# Patient Record
Sex: Female | Born: 1945 | Race: White | Hispanic: No | Marital: Married | Smoking: Never smoker
Health system: Southern US, Community
[De-identification: ages and names within clinical notes are randomized; demographics above are authoritative.]

## PROBLEM LIST (undated history)

## (undated) DIAGNOSIS — D649 Anemia, unspecified: Secondary | ICD-10-CM

## (undated) DIAGNOSIS — R51 Headache: Secondary | ICD-10-CM

## (undated) DIAGNOSIS — N63 Unspecified lump in unspecified breast: Secondary | ICD-10-CM

## (undated) DIAGNOSIS — R519 Headache, unspecified: Secondary | ICD-10-CM

## (undated) DIAGNOSIS — D249 Benign neoplasm of unspecified breast: Secondary | ICD-10-CM

## (undated) DIAGNOSIS — Z801 Family history of malignant neoplasm of trachea, bronchus and lung: Secondary | ICD-10-CM

## (undated) DIAGNOSIS — R238 Other skin changes: Secondary | ICD-10-CM

## (undated) DIAGNOSIS — R233 Spontaneous ecchymoses: Secondary | ICD-10-CM

## (undated) DIAGNOSIS — E785 Hyperlipidemia, unspecified: Secondary | ICD-10-CM

## (undated) DIAGNOSIS — I10 Essential (primary) hypertension: Secondary | ICD-10-CM

## (undated) HISTORY — DX: Family history of malignant neoplasm of trachea, bronchus and lung: Z80.1

## (undated) HISTORY — DX: Unspecified lump in unspecified breast: N63.0

## (undated) HISTORY — DX: Anemia, unspecified: D64.9

## (undated) HISTORY — DX: Headache: R51

## (undated) HISTORY — DX: Other skin changes: R23.8

## (undated) HISTORY — DX: Headache, unspecified: R51.9

## (undated) HISTORY — DX: Spontaneous ecchymoses: R23.3

## (undated) HISTORY — DX: Hyperlipidemia, unspecified: E78.5

## (undated) HISTORY — PX: EYE SURGERY: SHX253

## (undated) HISTORY — DX: Benign neoplasm of unspecified breast: D24.9

## (undated) HISTORY — DX: Essential (primary) hypertension: I10

## (undated) HISTORY — PX: BREAST LUMPECTOMY: SHX2

---

## 1986-03-29 HISTORY — PX: ABDOMINAL HYSTERECTOMY: SHX81

## 2001-10-23 ENCOUNTER — Encounter: Payer: Self-pay | Admitting: Neurosurgery

## 2001-10-23 ENCOUNTER — Encounter: Admission: RE | Admit: 2001-10-23 | Discharge: 2001-10-23 | Payer: Self-pay | Admitting: Neurosurgery

## 2001-11-18 ENCOUNTER — Ambulatory Visit (HOSPITAL_COMMUNITY): Admission: RE | Admit: 2001-11-18 | Discharge: 2001-11-18 | Payer: Self-pay | Admitting: Neurosurgery

## 2001-11-18 ENCOUNTER — Encounter: Payer: Self-pay | Admitting: Neurosurgery

## 2002-05-22 ENCOUNTER — Ambulatory Visit (HOSPITAL_COMMUNITY): Admission: RE | Admit: 2002-05-22 | Discharge: 2002-05-22 | Payer: Self-pay | Admitting: Unknown Physician Specialty

## 2005-06-15 ENCOUNTER — Ambulatory Visit (HOSPITAL_COMMUNITY): Admission: RE | Admit: 2005-06-15 | Discharge: 2005-06-15 | Payer: Self-pay | Admitting: Ophthalmology

## 2008-03-29 HISTORY — PX: ENUCLEATION: SHX628

## 2010-11-03 ENCOUNTER — Other Ambulatory Visit: Payer: Self-pay | Admitting: Family Medicine

## 2010-11-03 DIAGNOSIS — R921 Mammographic calcification found on diagnostic imaging of breast: Secondary | ICD-10-CM

## 2010-11-13 ENCOUNTER — Ambulatory Visit
Admission: RE | Admit: 2010-11-13 | Discharge: 2010-11-13 | Disposition: A | Discharge status: Home / Self Care | Payer: BC Managed Care – PPO | Source: Ambulatory Visit | Attending: Family Medicine | Admitting: Family Medicine

## 2010-11-13 ENCOUNTER — Other Ambulatory Visit: Payer: Self-pay | Admitting: Family Medicine

## 2010-11-13 DIAGNOSIS — R921 Mammographic calcification found on diagnostic imaging of breast: Secondary | ICD-10-CM

## 2010-12-01 ENCOUNTER — Ambulatory Visit (INDEPENDENT_AMBULATORY_CARE_PROVIDER_SITE_OTHER): Payer: BC Managed Care – PPO | Admitting: Surgery

## 2010-12-01 ENCOUNTER — Encounter (INDEPENDENT_AMBULATORY_CARE_PROVIDER_SITE_OTHER): Payer: Self-pay | Admitting: Surgery

## 2010-12-01 ENCOUNTER — Other Ambulatory Visit (INDEPENDENT_AMBULATORY_CARE_PROVIDER_SITE_OTHER): Payer: Self-pay | Admitting: Surgery

## 2010-12-01 VITALS — BP 134/82 | HR 84 | Temp 97.2°F | Ht 64.0 in | Wt 142.6 lb

## 2010-12-01 DIAGNOSIS — N63 Unspecified lump in unspecified breast: Secondary | ICD-10-CM

## 2010-12-01 DIAGNOSIS — N6320 Unspecified lump in the left breast, unspecified quadrant: Secondary | ICD-10-CM

## 2010-12-01 DIAGNOSIS — D249 Benign neoplasm of unspecified breast: Secondary | ICD-10-CM

## 2010-12-01 HISTORY — DX: Benign neoplasm of unspecified breast: D24.9

## 2010-12-01 NOTE — Patient Instructions (Addendum)
We will arrange surgery to remove the area found on your left breast. You will have to have a guide wire placed at [facility] just before the surgery.  Stop fish oil and aspirin 5 days before surgery.
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
## 2010-12-08 ENCOUNTER — Telehealth (INDEPENDENT_AMBULATORY_CARE_PROVIDER_SITE_OTHER): Payer: Self-pay | Admitting: General Surgery

## 2010-12-08 ENCOUNTER — Encounter (HOSPITAL_BASED_OUTPATIENT_CLINIC_OR_DEPARTMENT_OTHER)
Admission: RE | Admit: 2010-12-08 | Discharge: 2010-12-08 | Disposition: A | Discharge status: Home / Self Care | Payer: BC Managed Care – PPO | Source: Ambulatory Visit | Attending: Surgery | Admitting: Surgery

## 2010-12-08 LAB — BASIC METABOLIC PANEL
CO2: 28 mEq/L (ref 19–32)
Calcium: 10.4 mg/dL (ref 8.4–10.5)
Creatinine, Ser: 0.96 mg/dL (ref 0.50–1.10)
Glucose, Bld: 102 mg/dL — ABNORMAL HIGH (ref 70–99)

## 2010-12-08 NOTE — Telephone Encounter (Signed)
Message copied by Liliana Cline on Tue Dec 08, 2010  4:45 PM ------      Message from: Currie Paris      Created: Tue Dec 08, 2010  4:40 PM       These labs are OK for surgery

## 2010-12-08 NOTE — Telephone Encounter (Signed)
Labs okay for surgery faxed to pre-op.  

## 2010-12-10 ENCOUNTER — Other Ambulatory Visit (INDEPENDENT_AMBULATORY_CARE_PROVIDER_SITE_OTHER): Payer: Self-pay | Admitting: Surgery

## 2010-12-10 ENCOUNTER — Ambulatory Visit
Admission: RE | Admit: 2010-12-10 | Discharge: 2010-12-10 | Disposition: A | Discharge status: Home / Self Care | Payer: BC Managed Care – PPO | Source: Ambulatory Visit | Attending: Surgery | Admitting: Surgery

## 2010-12-10 ENCOUNTER — Encounter (INDEPENDENT_AMBULATORY_CARE_PROVIDER_SITE_OTHER): Payer: Self-pay | Admitting: Surgery

## 2010-12-10 ENCOUNTER — Ambulatory Visit (HOSPITAL_BASED_OUTPATIENT_CLINIC_OR_DEPARTMENT_OTHER)
Admission: RE | Admit: 2010-12-10 | Discharge: 2010-12-10 | Disposition: A | Discharge status: Home / Self Care | Payer: BC Managed Care – PPO | Source: Ambulatory Visit | Attending: Surgery | Admitting: Surgery

## 2010-12-10 DIAGNOSIS — N6320 Unspecified lump in the left breast, unspecified quadrant: Secondary | ICD-10-CM

## 2010-12-10 DIAGNOSIS — D249 Benign neoplasm of unspecified breast: Secondary | ICD-10-CM | POA: Insufficient documentation

## 2010-12-10 DIAGNOSIS — Z01812 Encounter for preprocedural laboratory examination: Secondary | ICD-10-CM | POA: Insufficient documentation

## 2010-12-10 DIAGNOSIS — Z01810 Encounter for preprocedural cardiovascular examination: Secondary | ICD-10-CM | POA: Insufficient documentation

## 2010-12-10 HISTORY — PX: BREAST SURGERY: SHX581

## 2010-12-10 HISTORY — PX: BREAST EXCISIONAL BIOPSY: SUR124

## 2010-12-10 NOTE — Op Note (Signed)
  [name], Pamela Blanchard               ACCOUNT NO.:  [id number]  MEDICAL RECORD NO.:  [id number]  LOCATION:                                 FACILITY:  PHYSICIAN:  Currie Paris, M.D.   DATE OF BIRTH:  DATE OF PROCEDURE: DATE OF DISCHARGE:                              OPERATIVE REPORT   PREOPERATIVE DIAGNOSIS:  Papilloma left breast, lower inner quadrant.  POSTOPERATIVE DIAGNOSIS:  Papilloma left breast, lower inner quadrant.  PROCEDURE:  Needle guided lumpectomy.  SURGEON:  Currie Paris, MD.  ANESTHESIA:  General.  CLINICAL HISTORY:  This is a 65 year old lady who recently presented with a mammographic abnormality and a biopsy showed a papilloma.  To rule out some foci of cancer and the papilloma, excisional biopsy was recommended.  DESCRIPTION OF PROCEDURE:  I saw the patient in the holding area and she had no further questions.  We confirmed the plans for the procedure as above.  The mammogram localizing films were reviewed.  The clip appeared to be 1.5 cm deep to the skin.  The guidewire appeared to transverse from medial to lateral.  The patient was taken to the operating room after satisfactory general anesthesia had been obtained.  The breast was prepped and draped and the time-out was done.  I then made a straight incision directly over the guidewire path, starting just medial to the guidewire as there was some induration right at the guidewire site, which I attributed to some hematoma and post biopsy changes from the original core biopsy.  I then elevated the skin a little bit superiorly and inferiorly, then divided the breast tissue going down towards the chest wall starting medially then superiorly, then inferiorly, and then under it, although did not go all the way down to the chest wall for the deep margins since this appeared to be benign. I continued to take a cylinder of tissue around the guidewire until I got beyond the tip.  I made sure  everything was dry.  I spent several minutes irrigating and then closed in layers with 3-0 Vicryl, 4-0 Monocryl, subcuticular, and Dermabond.  I used about 20 mL of 0.25% plain Marcaine to help with postop anesthesia.  Specimen mammogram showed the clip to be in the specimen and we thought we had the area out completely.  The patient tolerated the procedure well and there were no complications.  All counts were correct.     Currie Paris, M.D.     CJS/MEDQ  D:  12/10/2010  T:  12/10/2010  Job:  [id number]  Electronically Signed by Cyndia Bent M.D. on 12/10/2010 05:18:50 PM

## 2010-12-14 ENCOUNTER — Telehealth (INDEPENDENT_AMBULATORY_CARE_PROVIDER_SITE_OTHER): Payer: Self-pay | Admitting: General Surgery

## 2010-12-14 NOTE — Telephone Encounter (Signed)
Message copied by Liliana Cline on Mon Dec 14, 2010  9:38 AM ------      Message from: Currie Paris      Created: Sat Dec 12, 2010  1:14 PM       Tell her path is benign and as expected

## 2010-12-14 NOTE — Telephone Encounter (Signed)
Patient made aware of path results. Will follow up at appt and call with any questions prior.  

## 2010-12-24 ENCOUNTER — Ambulatory Visit (INDEPENDENT_AMBULATORY_CARE_PROVIDER_SITE_OTHER): Payer: BC Managed Care – PPO | Admitting: Surgery

## 2010-12-24 ENCOUNTER — Encounter (INDEPENDENT_AMBULATORY_CARE_PROVIDER_SITE_OTHER): Payer: Self-pay | Admitting: Surgery

## 2010-12-24 VITALS — BP 124/82 | HR 84 | Temp 97.5°F | Resp 12 | Ht 64.0 in | Wt 141.2 lb

## 2010-12-24 DIAGNOSIS — D249 Benign neoplasm of unspecified breast: Secondary | ICD-10-CM

## 2010-12-24 NOTE — Progress Notes (Signed)
Pamela Blanchard    [id number] 12/24/2010    1945-05-01   CC: Post op visit  HPI: The patient returns for post op follow-up. She underwent a left needle guided removal of a breast mass on 12/10/10. Over all she feels that she is doing well.  PE: The incision is healing nicely and there is no evidence of infection or hematoma.    DATA REVIEWED: Pathology report showed intraductal papilloma  IMPRESSION: Patient doing well.   PLAN: She will return p.r.n. .

## 2010-12-24 NOTE — Patient Instructions (Signed)
We will see you again on an as needed basis. Please call the office at [phone] if you have any questions or concerns. Thank you for allowing us to take care of you.  

## 2013-06-28 ENCOUNTER — Other Ambulatory Visit (HOSPITAL_COMMUNITY): Payer: Self-pay | Admitting: Neurosurgery

## 2013-06-28 DIAGNOSIS — M5126 Other intervertebral disc displacement, lumbar region: Secondary | ICD-10-CM

## 2013-06-29 ENCOUNTER — Ambulatory Visit (HOSPITAL_COMMUNITY)
Admission: RE | Admit: 2013-06-29 | Discharge: 2013-06-29 | Disposition: A | Discharge status: Home / Self Care | Payer: Medicare Other | Source: Ambulatory Visit | Attending: Neurosurgery | Admitting: Neurosurgery

## 2013-06-29 DIAGNOSIS — M5126 Other intervertebral disc displacement, lumbar region: Secondary | ICD-10-CM

## 2013-06-29 DIAGNOSIS — M79609 Pain in unspecified limb: Secondary | ICD-10-CM | POA: Insufficient documentation

## 2013-06-29 DIAGNOSIS — R252 Cramp and spasm: Secondary | ICD-10-CM | POA: Insufficient documentation

## 2013-06-29 DIAGNOSIS — M48061 Spinal stenosis, lumbar region without neurogenic claudication: Secondary | ICD-10-CM | POA: Insufficient documentation

## 2013-06-29 LAB — POCT I-STAT CREATININE: CREATININE: 1.2 mg/dL — AB (ref 0.50–1.10)

## 2013-06-29 MED ORDER — GADOBENATE DIMEGLUMINE 529 MG/ML IV SOLN
13.0000 mL | Freq: Once | INTRAVENOUS | Status: AC | PRN
  Administered 2013-06-29: 13 mL via INTRAVENOUS

## 2015-07-21 ENCOUNTER — Other Ambulatory Visit: Payer: Self-pay

## 2015-07-21 DIAGNOSIS — Z1231 Encounter for screening mammogram for malignant neoplasm of breast: Secondary | ICD-10-CM

## 2015-09-02 ENCOUNTER — Ambulatory Visit
Admission: RE | Admit: 2015-09-02 | Discharge: 2015-09-02 | Disposition: A | Discharge status: Home / Self Care | Payer: Medicare Other | Source: Ambulatory Visit

## 2015-09-02 ENCOUNTER — Other Ambulatory Visit: Payer: Self-pay | Admitting: Family Medicine

## 2015-09-02 DIAGNOSIS — Z1231 Encounter for screening mammogram for malignant neoplasm of breast: Secondary | ICD-10-CM

## 2016-08-02 ENCOUNTER — Other Ambulatory Visit: Payer: Self-pay | Admitting: Family Medicine

## 2016-08-02 DIAGNOSIS — Z1231 Encounter for screening mammogram for malignant neoplasm of breast: Secondary | ICD-10-CM

## 2016-09-02 ENCOUNTER — Ambulatory Visit
Admission: RE | Admit: 2016-09-02 | Discharge: 2016-09-02 | Disposition: A | Discharge status: Home / Self Care | Payer: Medicare Other | Source: Ambulatory Visit | Attending: Family Medicine | Admitting: Family Medicine

## 2016-09-02 DIAGNOSIS — Z1231 Encounter for screening mammogram for malignant neoplasm of breast: Secondary | ICD-10-CM

## 2017-08-01 ENCOUNTER — Other Ambulatory Visit: Payer: Self-pay | Admitting: Family Medicine

## 2017-08-01 DIAGNOSIS — Z1231 Encounter for screening mammogram for malignant neoplasm of breast: Secondary | ICD-10-CM

## 2017-09-05 ENCOUNTER — Ambulatory Visit
Admission: RE | Admit: 2017-09-05 | Discharge: 2017-09-05 | Disposition: A | Discharge status: Home / Self Care | Payer: 59 | Source: Ambulatory Visit | Attending: Family Medicine | Admitting: Family Medicine

## 2017-09-05 DIAGNOSIS — Z1231 Encounter for screening mammogram for malignant neoplasm of breast: Secondary | ICD-10-CM

## 2018-07-28 ENCOUNTER — Other Ambulatory Visit: Payer: Self-pay | Admitting: Family Medicine

## 2018-07-28 DIAGNOSIS — Z1231 Encounter for screening mammogram for malignant neoplasm of breast: Secondary | ICD-10-CM

## 2018-08-02 ENCOUNTER — Ambulatory Visit: Payer: Self-pay | Admitting: Allergy & Immunology

## 2018-08-11 ENCOUNTER — Ambulatory Visit: Payer: Self-pay | Admitting: Allergy & Immunology

## 2018-09-21 ENCOUNTER — Other Ambulatory Visit: Payer: Self-pay

## 2018-09-21 ENCOUNTER — Ambulatory Visit
Admission: RE | Admit: 2018-09-21 | Discharge: 2018-09-21 | Disposition: A | Discharge status: Home / Self Care | Payer: Medicare Other | Source: Ambulatory Visit | Attending: Family Medicine | Admitting: Family Medicine

## 2018-09-21 DIAGNOSIS — Z1231 Encounter for screening mammogram for malignant neoplasm of breast: Secondary | ICD-10-CM

## 2019-08-14 ENCOUNTER — Other Ambulatory Visit: Payer: Self-pay | Admitting: Family Medicine

## 2019-08-14 DIAGNOSIS — Z1231 Encounter for screening mammogram for malignant neoplasm of breast: Secondary | ICD-10-CM

## 2019-09-24 ENCOUNTER — Ambulatory Visit
Admission: RE | Admit: 2019-09-24 | Discharge: 2019-09-24 | Disposition: A | Discharge status: Home / Self Care | Payer: Medicare PPO | Source: Ambulatory Visit | Attending: Family Medicine | Admitting: Family Medicine

## 2019-09-24 ENCOUNTER — Other Ambulatory Visit: Payer: Self-pay

## 2019-09-24 DIAGNOSIS — Z1231 Encounter for screening mammogram for malignant neoplasm of breast: Secondary | ICD-10-CM

## 2019-11-27 DIAGNOSIS — R7301 Impaired fasting glucose: Secondary | ICD-10-CM | POA: Diagnosis not present

## 2019-11-27 DIAGNOSIS — E7849 Other hyperlipidemia: Secondary | ICD-10-CM | POA: Diagnosis not present

## 2019-11-27 DIAGNOSIS — I10 Essential (primary) hypertension: Secondary | ICD-10-CM | POA: Diagnosis not present

## 2019-11-27 DIAGNOSIS — E119 Type 2 diabetes mellitus without complications: Secondary | ICD-10-CM | POA: Diagnosis not present

## 2019-12-27 DIAGNOSIS — I10 Essential (primary) hypertension: Secondary | ICD-10-CM | POA: Diagnosis not present

## 2019-12-27 DIAGNOSIS — E7849 Other hyperlipidemia: Secondary | ICD-10-CM | POA: Diagnosis not present

## 2019-12-27 DIAGNOSIS — E119 Type 2 diabetes mellitus without complications: Secondary | ICD-10-CM | POA: Diagnosis not present

## 2019-12-27 DIAGNOSIS — R7301 Impaired fasting glucose: Secondary | ICD-10-CM | POA: Diagnosis not present

## 2020-01-09 DIAGNOSIS — E119 Type 2 diabetes mellitus without complications: Secondary | ICD-10-CM | POA: Diagnosis not present

## 2020-01-09 DIAGNOSIS — E782 Mixed hyperlipidemia: Secondary | ICD-10-CM | POA: Diagnosis not present

## 2020-01-09 DIAGNOSIS — I10 Essential (primary) hypertension: Secondary | ICD-10-CM | POA: Diagnosis not present

## 2020-01-11 DIAGNOSIS — E782 Mixed hyperlipidemia: Secondary | ICD-10-CM | POA: Diagnosis not present

## 2020-01-11 DIAGNOSIS — R7301 Impaired fasting glucose: Secondary | ICD-10-CM | POA: Diagnosis not present

## 2020-01-11 DIAGNOSIS — I10 Essential (primary) hypertension: Secondary | ICD-10-CM | POA: Diagnosis not present

## 2020-01-11 DIAGNOSIS — Z6825 Body mass index (BMI) 25.0-25.9, adult: Secondary | ICD-10-CM | POA: Diagnosis not present

## 2020-01-11 DIAGNOSIS — F419 Anxiety disorder, unspecified: Secondary | ICD-10-CM | POA: Diagnosis not present

## 2020-01-11 DIAGNOSIS — R4582 Worries: Secondary | ICD-10-CM | POA: Diagnosis not present

## 2020-01-11 DIAGNOSIS — F331 Major depressive disorder, recurrent, moderate: Secondary | ICD-10-CM | POA: Diagnosis not present

## 2020-03-28 DIAGNOSIS — E7849 Other hyperlipidemia: Secondary | ICD-10-CM | POA: Diagnosis not present

## 2020-03-28 DIAGNOSIS — E119 Type 2 diabetes mellitus without complications: Secondary | ICD-10-CM | POA: Diagnosis not present

## 2020-03-28 DIAGNOSIS — I10 Essential (primary) hypertension: Secondary | ICD-10-CM | POA: Diagnosis not present

## 2020-03-31 DIAGNOSIS — L814 Other melanin hyperpigmentation: Secondary | ICD-10-CM | POA: Diagnosis not present

## 2020-03-31 DIAGNOSIS — I781 Nevus, non-neoplastic: Secondary | ICD-10-CM | POA: Diagnosis not present

## 2020-03-31 DIAGNOSIS — L570 Actinic keratosis: Secondary | ICD-10-CM | POA: Diagnosis not present

## 2020-03-31 DIAGNOSIS — D1801 Hemangioma of skin and subcutaneous tissue: Secondary | ICD-10-CM | POA: Diagnosis not present

## 2020-04-17 DIAGNOSIS — H524 Presbyopia: Secondary | ICD-10-CM | POA: Diagnosis not present

## 2020-04-17 DIAGNOSIS — H26492 Other secondary cataract, left eye: Secondary | ICD-10-CM | POA: Diagnosis not present

## 2020-04-17 DIAGNOSIS — H54415A Blindness right eye category 5, normal vision left eye: Secondary | ICD-10-CM | POA: Diagnosis not present

## 2020-04-17 DIAGNOSIS — Z961 Presence of intraocular lens: Secondary | ICD-10-CM | POA: Diagnosis not present

## 2020-04-26 DIAGNOSIS — E119 Type 2 diabetes mellitus without complications: Secondary | ICD-10-CM | POA: Diagnosis not present

## 2020-04-26 DIAGNOSIS — R7301 Impaired fasting glucose: Secondary | ICD-10-CM | POA: Diagnosis not present

## 2020-04-26 DIAGNOSIS — I10 Essential (primary) hypertension: Secondary | ICD-10-CM | POA: Diagnosis not present

## 2020-04-26 DIAGNOSIS — E7849 Other hyperlipidemia: Secondary | ICD-10-CM | POA: Diagnosis not present

## 2020-05-22 DIAGNOSIS — H26492 Other secondary cataract, left eye: Secondary | ICD-10-CM | POA: Diagnosis not present

## 2020-06-16 DIAGNOSIS — E7800 Pure hypercholesterolemia, unspecified: Secondary | ICD-10-CM | POA: Diagnosis not present

## 2020-06-16 DIAGNOSIS — Z1329 Encounter for screening for other suspected endocrine disorder: Secondary | ICD-10-CM | POA: Diagnosis not present

## 2020-06-16 DIAGNOSIS — Z0000 Encounter for general adult medical examination without abnormal findings: Secondary | ICD-10-CM | POA: Diagnosis not present

## 2020-06-16 DIAGNOSIS — I10 Essential (primary) hypertension: Secondary | ICD-10-CM | POA: Diagnosis not present

## 2020-06-16 DIAGNOSIS — E7801 Familial hypercholesterolemia: Secondary | ICD-10-CM | POA: Diagnosis not present

## 2020-06-16 DIAGNOSIS — Z1159 Encounter for screening for other viral diseases: Secondary | ICD-10-CM | POA: Diagnosis not present

## 2020-06-16 DIAGNOSIS — E119 Type 2 diabetes mellitus without complications: Secondary | ICD-10-CM | POA: Diagnosis not present

## 2020-06-19 DIAGNOSIS — R7301 Impaired fasting glucose: Secondary | ICD-10-CM | POA: Diagnosis not present

## 2020-06-19 DIAGNOSIS — Z0001 Encounter for general adult medical examination with abnormal findings: Secondary | ICD-10-CM | POA: Diagnosis not present

## 2020-06-19 DIAGNOSIS — Z1331 Encounter for screening for depression: Secondary | ICD-10-CM | POA: Diagnosis not present

## 2020-06-19 DIAGNOSIS — F419 Anxiety disorder, unspecified: Secondary | ICD-10-CM | POA: Diagnosis not present

## 2020-06-19 DIAGNOSIS — I10 Essential (primary) hypertension: Secondary | ICD-10-CM | POA: Diagnosis not present

## 2020-06-19 DIAGNOSIS — Z1389 Encounter for screening for other disorder: Secondary | ICD-10-CM | POA: Diagnosis not present

## 2020-06-19 DIAGNOSIS — E7849 Other hyperlipidemia: Secondary | ICD-10-CM | POA: Diagnosis not present

## 2020-06-19 DIAGNOSIS — F331 Major depressive disorder, recurrent, moderate: Secondary | ICD-10-CM | POA: Diagnosis not present

## 2020-07-04 DIAGNOSIS — Z23 Encounter for immunization: Secondary | ICD-10-CM | POA: Diagnosis not present

## 2020-07-26 DIAGNOSIS — E119 Type 2 diabetes mellitus without complications: Secondary | ICD-10-CM | POA: Diagnosis not present

## 2020-07-26 DIAGNOSIS — I10 Essential (primary) hypertension: Secondary | ICD-10-CM | POA: Diagnosis not present

## 2020-07-26 DIAGNOSIS — R7301 Impaired fasting glucose: Secondary | ICD-10-CM | POA: Diagnosis not present

## 2020-07-26 DIAGNOSIS — E7849 Other hyperlipidemia: Secondary | ICD-10-CM | POA: Diagnosis not present

## 2020-08-15 ENCOUNTER — Other Ambulatory Visit: Payer: Self-pay | Admitting: Family Medicine

## 2020-08-15 DIAGNOSIS — Z1231 Encounter for screening mammogram for malignant neoplasm of breast: Secondary | ICD-10-CM

## 2020-08-25 DIAGNOSIS — R7301 Impaired fasting glucose: Secondary | ICD-10-CM | POA: Diagnosis not present

## 2020-08-25 DIAGNOSIS — I10 Essential (primary) hypertension: Secondary | ICD-10-CM | POA: Diagnosis not present

## 2020-08-25 DIAGNOSIS — E119 Type 2 diabetes mellitus without complications: Secondary | ICD-10-CM | POA: Diagnosis not present

## 2020-08-25 DIAGNOSIS — E7849 Other hyperlipidemia: Secondary | ICD-10-CM | POA: Diagnosis not present

## 2020-09-25 DIAGNOSIS — R7301 Impaired fasting glucose: Secondary | ICD-10-CM | POA: Diagnosis not present

## 2020-09-25 DIAGNOSIS — E119 Type 2 diabetes mellitus without complications: Secondary | ICD-10-CM | POA: Diagnosis not present

## 2020-09-25 DIAGNOSIS — E7849 Other hyperlipidemia: Secondary | ICD-10-CM | POA: Diagnosis not present

## 2020-09-25 DIAGNOSIS — I10 Essential (primary) hypertension: Secondary | ICD-10-CM | POA: Diagnosis not present

## 2020-10-06 ENCOUNTER — Ambulatory Visit
Admission: RE | Admit: 2020-10-06 | Discharge: 2020-10-06 | Disposition: A | Discharge status: Home / Self Care | Payer: Medicare PPO | Source: Ambulatory Visit | Attending: Family Medicine | Admitting: Family Medicine

## 2020-10-06 ENCOUNTER — Other Ambulatory Visit: Payer: Self-pay

## 2020-10-06 DIAGNOSIS — Z1231 Encounter for screening mammogram for malignant neoplasm of breast: Secondary | ICD-10-CM

## 2020-10-14 ENCOUNTER — Ambulatory Visit: Payer: Medicare PPO

## 2020-10-26 DIAGNOSIS — E7849 Other hyperlipidemia: Secondary | ICD-10-CM | POA: Diagnosis not present

## 2020-10-26 DIAGNOSIS — R7301 Impaired fasting glucose: Secondary | ICD-10-CM | POA: Diagnosis not present

## 2020-10-26 DIAGNOSIS — E119 Type 2 diabetes mellitus without complications: Secondary | ICD-10-CM | POA: Diagnosis not present

## 2020-10-26 DIAGNOSIS — I10 Essential (primary) hypertension: Secondary | ICD-10-CM | POA: Diagnosis not present

## 2020-12-16 DIAGNOSIS — E7800 Pure hypercholesterolemia, unspecified: Secondary | ICD-10-CM | POA: Diagnosis not present

## 2020-12-16 DIAGNOSIS — I10 Essential (primary) hypertension: Secondary | ICD-10-CM | POA: Diagnosis not present

## 2020-12-16 DIAGNOSIS — E7801 Familial hypercholesterolemia: Secondary | ICD-10-CM | POA: Diagnosis not present

## 2020-12-16 DIAGNOSIS — R7301 Impaired fasting glucose: Secondary | ICD-10-CM | POA: Diagnosis not present

## 2020-12-16 DIAGNOSIS — E782 Mixed hyperlipidemia: Secondary | ICD-10-CM | POA: Diagnosis not present

## 2020-12-16 DIAGNOSIS — E119 Type 2 diabetes mellitus without complications: Secondary | ICD-10-CM | POA: Diagnosis not present

## 2020-12-16 DIAGNOSIS — E7849 Other hyperlipidemia: Secondary | ICD-10-CM | POA: Diagnosis not present

## 2020-12-19 DIAGNOSIS — I10 Essential (primary) hypertension: Secondary | ICD-10-CM | POA: Diagnosis not present

## 2020-12-19 DIAGNOSIS — R7301 Impaired fasting glucose: Secondary | ICD-10-CM | POA: Diagnosis not present

## 2020-12-19 DIAGNOSIS — F331 Major depressive disorder, recurrent, moderate: Secondary | ICD-10-CM | POA: Diagnosis not present

## 2020-12-19 DIAGNOSIS — F419 Anxiety disorder, unspecified: Secondary | ICD-10-CM | POA: Diagnosis not present

## 2020-12-19 DIAGNOSIS — R4582 Worries: Secondary | ICD-10-CM | POA: Diagnosis not present

## 2020-12-19 DIAGNOSIS — Z23 Encounter for immunization: Secondary | ICD-10-CM | POA: Diagnosis not present

## 2020-12-19 DIAGNOSIS — Z6824 Body mass index (BMI) 24.0-24.9, adult: Secondary | ICD-10-CM | POA: Diagnosis not present

## 2020-12-19 DIAGNOSIS — E7849 Other hyperlipidemia: Secondary | ICD-10-CM | POA: Diagnosis not present

## 2020-12-23 DIAGNOSIS — Z23 Encounter for immunization: Secondary | ICD-10-CM | POA: Diagnosis not present

## 2021-02-03 DIAGNOSIS — Z6824 Body mass index (BMI) 24.0-24.9, adult: Secondary | ICD-10-CM | POA: Diagnosis not present

## 2021-02-03 DIAGNOSIS — R300 Dysuria: Secondary | ICD-10-CM | POA: Diagnosis not present

## 2021-03-31 DIAGNOSIS — Z85828 Personal history of other malignant neoplasm of skin: Secondary | ICD-10-CM | POA: Diagnosis not present

## 2021-03-31 DIAGNOSIS — L738 Other specified follicular disorders: Secondary | ICD-10-CM | POA: Diagnosis not present

## 2021-03-31 DIAGNOSIS — Z1283 Encounter for screening for malignant neoplasm of skin: Secondary | ICD-10-CM | POA: Diagnosis not present

## 2021-03-31 DIAGNOSIS — L570 Actinic keratosis: Secondary | ICD-10-CM | POA: Diagnosis not present

## 2021-03-31 DIAGNOSIS — D485 Neoplasm of uncertain behavior of skin: Secondary | ICD-10-CM | POA: Diagnosis not present

## 2021-05-12 DIAGNOSIS — Z961 Presence of intraocular lens: Secondary | ICD-10-CM | POA: Diagnosis not present

## 2021-05-12 DIAGNOSIS — Z970 Presence of artificial eye: Secondary | ICD-10-CM | POA: Diagnosis not present

## 2021-06-19 DIAGNOSIS — R5383 Other fatigue: Secondary | ICD-10-CM | POA: Diagnosis not present

## 2021-06-19 DIAGNOSIS — E7800 Pure hypercholesterolemia, unspecified: Secondary | ICD-10-CM | POA: Diagnosis not present

## 2021-06-19 DIAGNOSIS — Z1329 Encounter for screening for other suspected endocrine disorder: Secondary | ICD-10-CM | POA: Diagnosis not present

## 2021-06-19 DIAGNOSIS — E7801 Familial hypercholesterolemia: Secondary | ICD-10-CM | POA: Diagnosis not present

## 2021-06-19 DIAGNOSIS — I10 Essential (primary) hypertension: Secondary | ICD-10-CM | POA: Diagnosis not present

## 2021-06-19 DIAGNOSIS — E782 Mixed hyperlipidemia: Secondary | ICD-10-CM | POA: Diagnosis not present

## 2021-06-19 DIAGNOSIS — E7849 Other hyperlipidemia: Secondary | ICD-10-CM | POA: Diagnosis not present

## 2021-06-22 DIAGNOSIS — I10 Essential (primary) hypertension: Secondary | ICD-10-CM | POA: Diagnosis not present

## 2021-06-22 DIAGNOSIS — Z1331 Encounter for screening for depression: Secondary | ICD-10-CM | POA: Diagnosis not present

## 2021-06-22 DIAGNOSIS — Z23 Encounter for immunization: Secondary | ICD-10-CM | POA: Diagnosis not present

## 2021-06-22 DIAGNOSIS — R7301 Impaired fasting glucose: Secondary | ICD-10-CM | POA: Diagnosis not present

## 2021-06-22 DIAGNOSIS — Z1389 Encounter for screening for other disorder: Secondary | ICD-10-CM | POA: Diagnosis not present

## 2021-06-22 DIAGNOSIS — Z1212 Encounter for screening for malignant neoplasm of rectum: Secondary | ICD-10-CM | POA: Diagnosis not present

## 2021-06-22 DIAGNOSIS — E7849 Other hyperlipidemia: Secondary | ICD-10-CM | POA: Diagnosis not present

## 2021-06-22 DIAGNOSIS — Z0001 Encounter for general adult medical examination with abnormal findings: Secondary | ICD-10-CM | POA: Diagnosis not present

## 2021-07-30 DIAGNOSIS — M810 Age-related osteoporosis without current pathological fracture: Secondary | ICD-10-CM | POA: Diagnosis not present

## 2021-07-30 DIAGNOSIS — M8589 Other specified disorders of bone density and structure, multiple sites: Secondary | ICD-10-CM | POA: Diagnosis not present

## 2021-10-08 DIAGNOSIS — Z1231 Encounter for screening mammogram for malignant neoplasm of breast: Secondary | ICD-10-CM | POA: Diagnosis not present

## 2021-11-23 DIAGNOSIS — E7849 Other hyperlipidemia: Secondary | ICD-10-CM | POA: Diagnosis not present

## 2021-11-23 DIAGNOSIS — R5383 Other fatigue: Secondary | ICD-10-CM | POA: Diagnosis not present

## 2021-11-23 DIAGNOSIS — I10 Essential (primary) hypertension: Secondary | ICD-10-CM | POA: Diagnosis not present

## 2021-11-23 DIAGNOSIS — E7801 Familial hypercholesterolemia: Secondary | ICD-10-CM | POA: Diagnosis not present

## 2021-11-23 DIAGNOSIS — R7301 Impaired fasting glucose: Secondary | ICD-10-CM | POA: Diagnosis not present

## 2021-11-23 DIAGNOSIS — E119 Type 2 diabetes mellitus without complications: Secondary | ICD-10-CM | POA: Diagnosis not present

## 2021-11-25 DIAGNOSIS — E7849 Other hyperlipidemia: Secondary | ICD-10-CM | POA: Diagnosis not present

## 2021-11-25 DIAGNOSIS — I10 Essential (primary) hypertension: Secondary | ICD-10-CM | POA: Diagnosis not present

## 2021-11-25 DIAGNOSIS — F419 Anxiety disorder, unspecified: Secondary | ICD-10-CM | POA: Diagnosis not present

## 2021-11-25 DIAGNOSIS — F331 Major depressive disorder, recurrent, moderate: Secondary | ICD-10-CM | POA: Diagnosis not present

## 2021-11-25 DIAGNOSIS — R7301 Impaired fasting glucose: Secondary | ICD-10-CM | POA: Diagnosis not present

## 2021-11-25 DIAGNOSIS — R0789 Other chest pain: Secondary | ICD-10-CM | POA: Diagnosis not present

## 2021-11-25 DIAGNOSIS — Z6824 Body mass index (BMI) 24.0-24.9, adult: Secondary | ICD-10-CM | POA: Diagnosis not present

## 2021-11-25 DIAGNOSIS — R4582 Worries: Secondary | ICD-10-CM | POA: Diagnosis not present

## 2021-12-08 DIAGNOSIS — Z23 Encounter for immunization: Secondary | ICD-10-CM | POA: Diagnosis not present

## 2021-12-25 IMAGING — MG MM DIGITAL SCREENING BILAT W/ TOMO AND CAD
8 series · 8 of 24 positions shown · non-contrast
Comparison: Previous exam(s).

CLINICAL DATA: Screening.

EXAM:
DIGITAL SCREENING BILATERAL MAMMOGRAM WITH TOMOSYNTHESIS AND CAD
TECHNIQUE: Bilateral screening digital craniocaudal and mediolateral oblique
mammograms were obtained. Bilateral screening digital breast
tomosynthesis was performed. The images were evaluated with
computer-aided detection.

[L CC synth-2D]
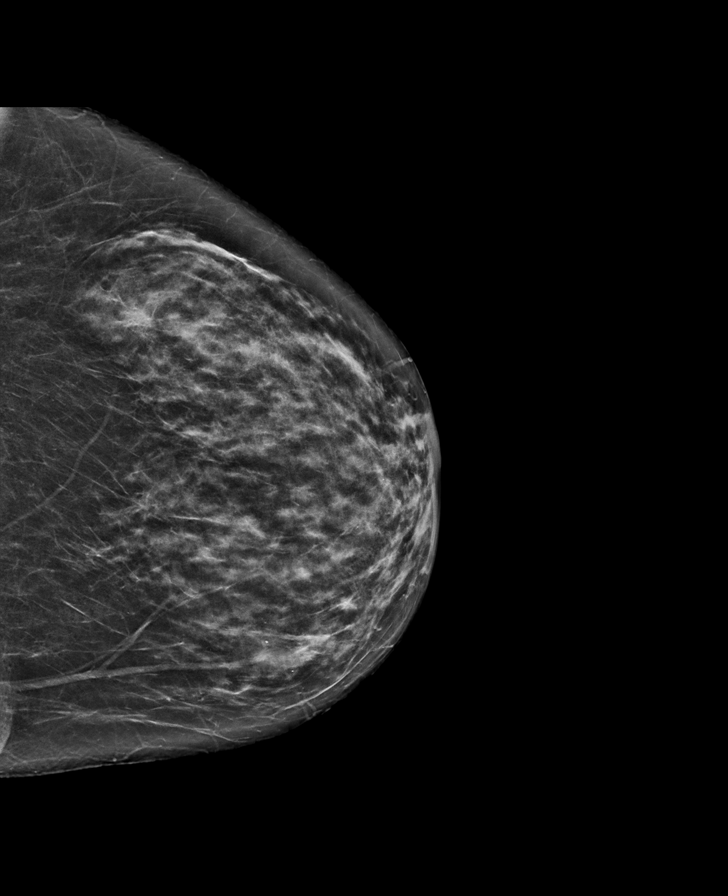

[R MLO synth-2D]
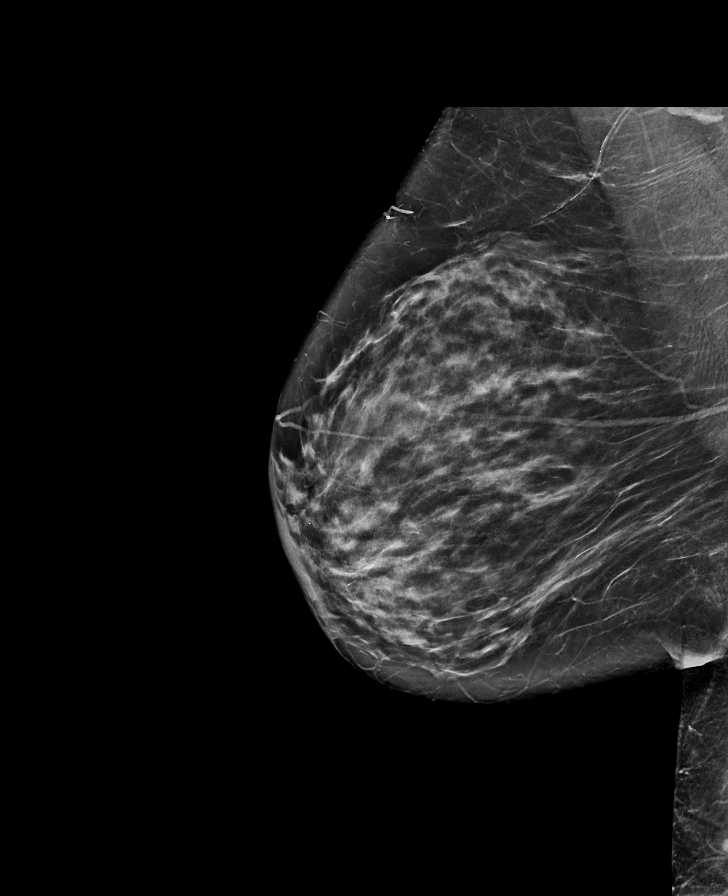

[R CC synth-2D]
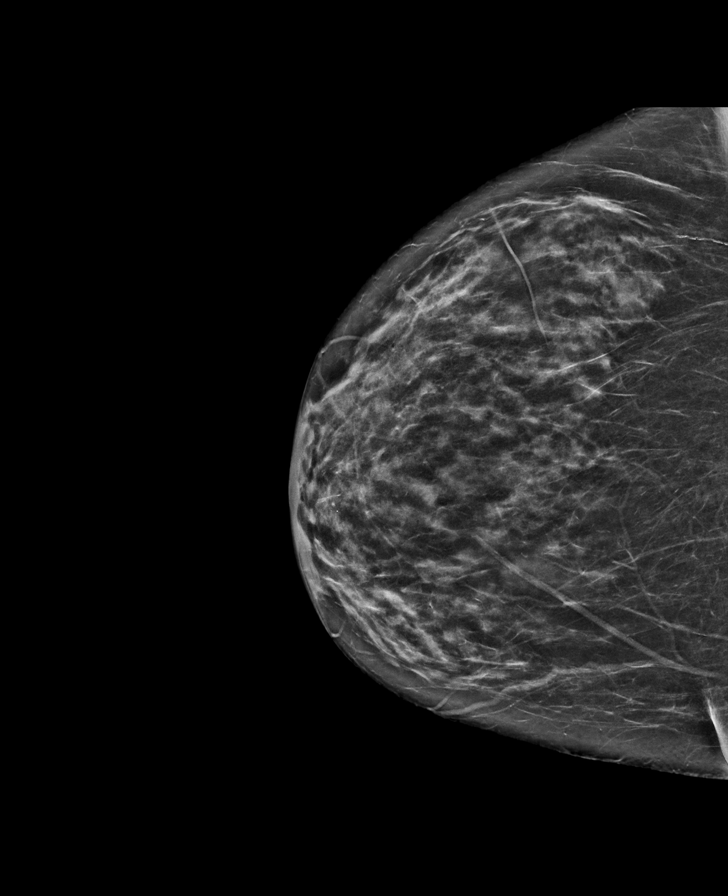

[L MLO synth-2D]
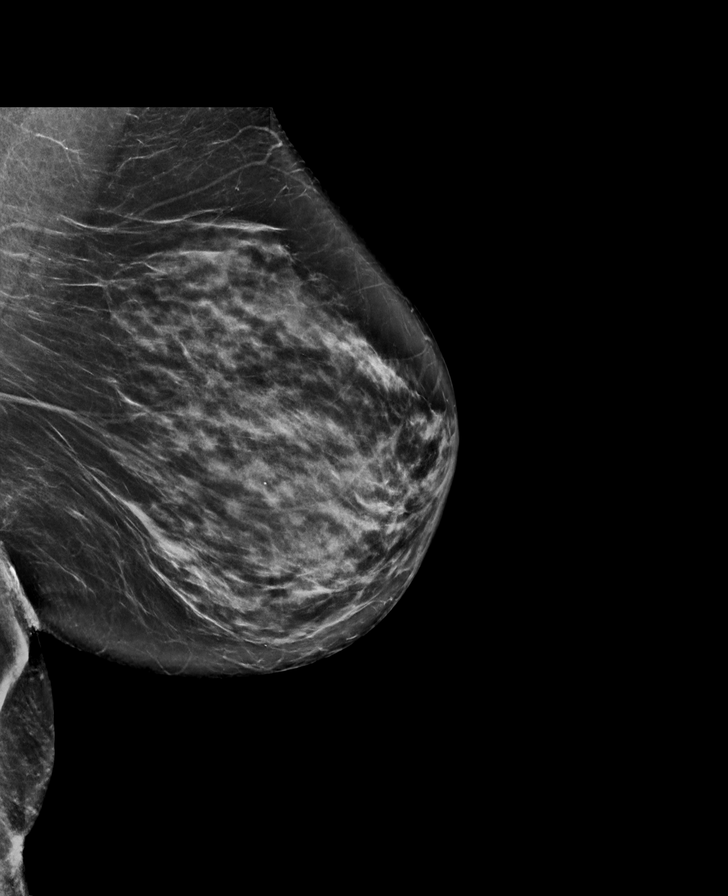

[L CC tomo · tomo slice 34/67.0]
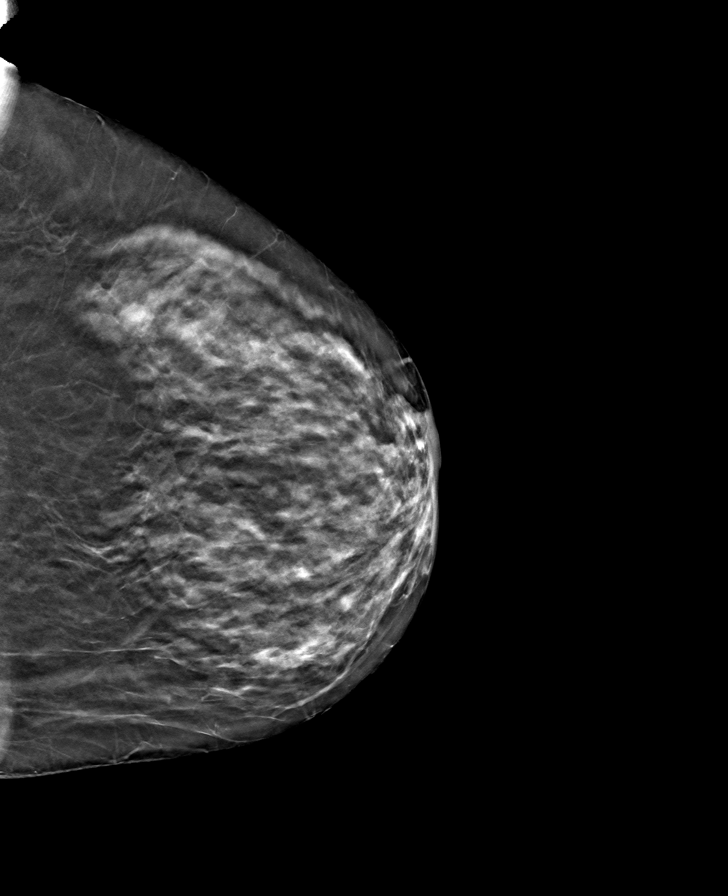

[L MLO tomo · tomo slice 39/77.0]
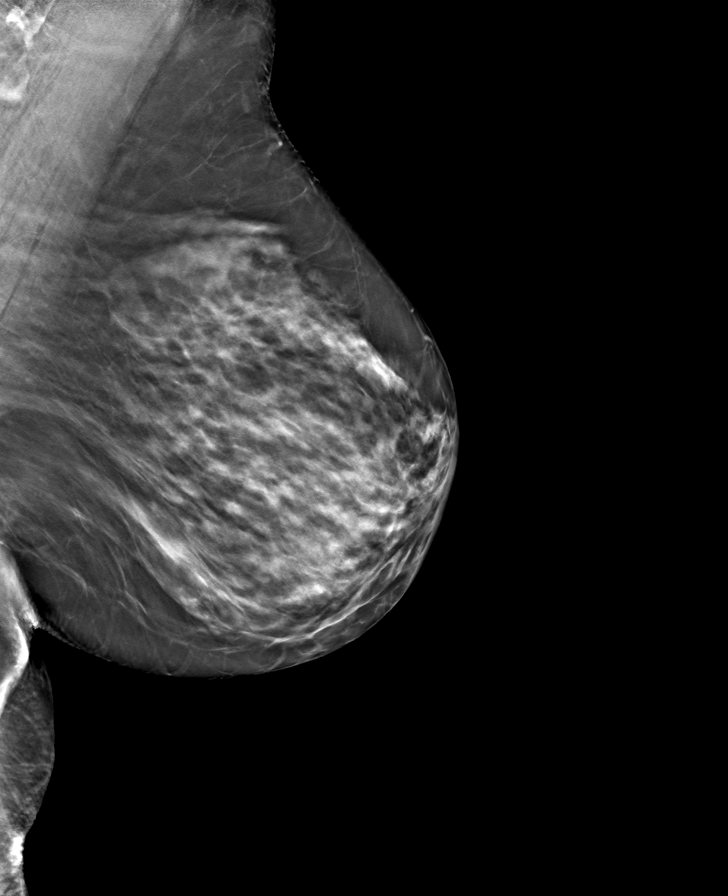

[R MLO tomo · tomo slice 39/78.0]
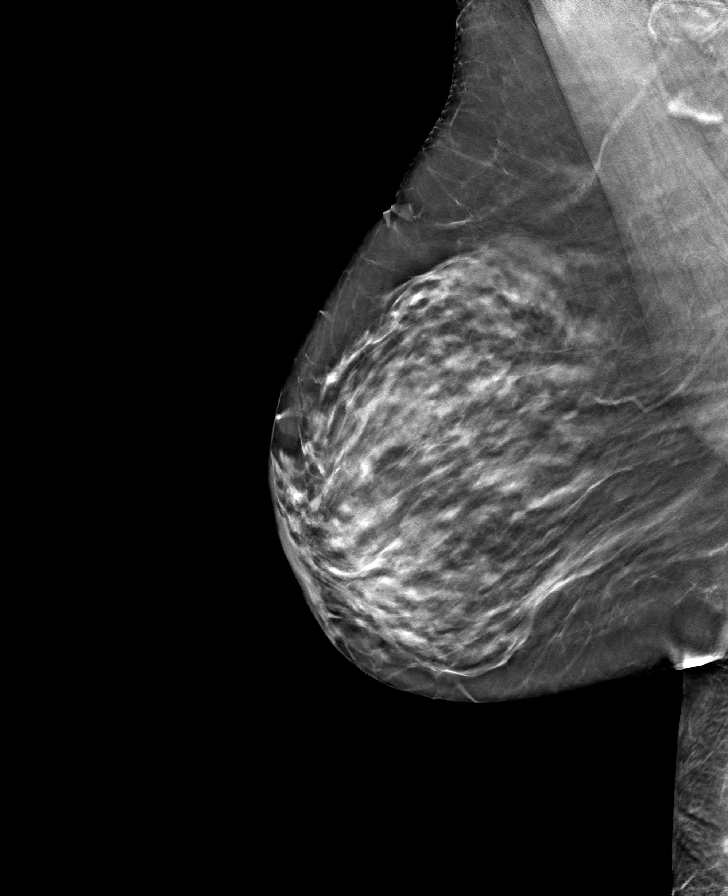

[R CC tomo · tomo slice 33/66.0]
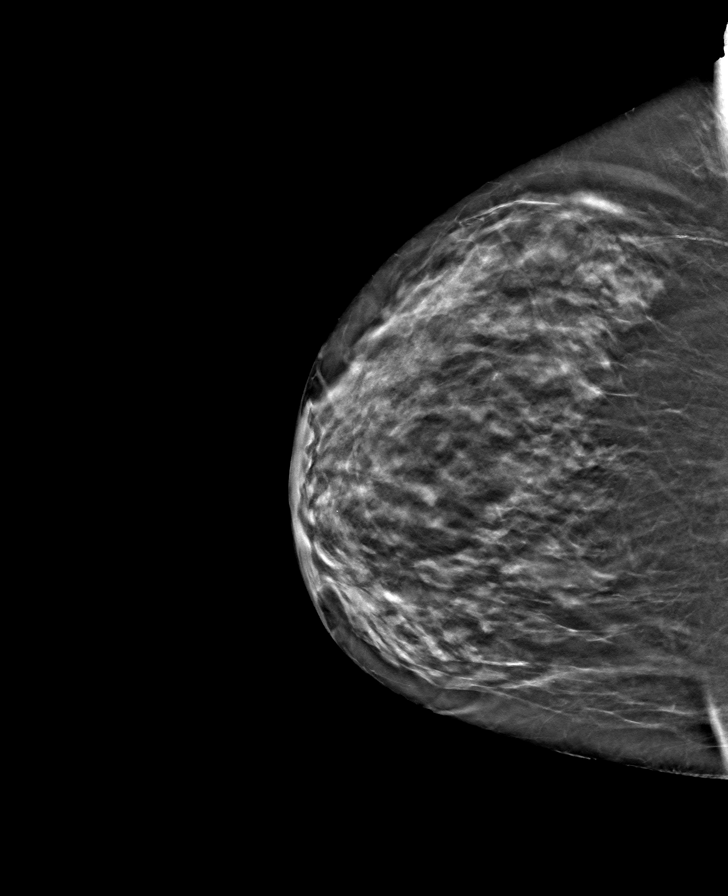

[8 of 24 positions shown; findings below may reference images not displayed]

ACR Breast Density Category c: The breast tissue is heterogeneously
dense, which may obscure small masses.
FINDINGS: There are no findings suspicious for malignancy.
IMPRESSION: No mammographic evidence of malignancy. A result letter of this
screening mammogram will be mailed directly to the patient.

RECOMMENDATION:
Screening mammogram in one year. (Code:Q3-W-BC3)

BI-RADS CATEGORY  1: Negative.

## 2022-01-12 DIAGNOSIS — Z23 Encounter for immunization: Secondary | ICD-10-CM | POA: Diagnosis not present

## 2022-01-17 DIAGNOSIS — M25512 Pain in left shoulder: Secondary | ICD-10-CM | POA: Diagnosis not present

## 2022-01-17 DIAGNOSIS — W1839XA Other fall on same level, initial encounter: Secondary | ICD-10-CM | POA: Diagnosis not present

## 2022-01-17 DIAGNOSIS — S299XXA Unspecified injury of thorax, initial encounter: Secondary | ICD-10-CM | POA: Diagnosis not present

## 2022-01-17 DIAGNOSIS — S42212A Unspecified displaced fracture of surgical neck of left humerus, initial encounter for closed fracture: Secondary | ICD-10-CM | POA: Diagnosis not present

## 2022-01-17 DIAGNOSIS — S42302A Unspecified fracture of shaft of humerus, left arm, initial encounter for closed fracture: Secondary | ICD-10-CM | POA: Diagnosis not present

## 2022-01-17 DIAGNOSIS — S42292A Other displaced fracture of upper end of left humerus, initial encounter for closed fracture: Secondary | ICD-10-CM | POA: Diagnosis not present

## 2022-01-25 DIAGNOSIS — W19XXXA Unspecified fall, initial encounter: Secondary | ICD-10-CM | POA: Diagnosis not present

## 2022-01-25 DIAGNOSIS — S42292A Other displaced fracture of upper end of left humerus, initial encounter for closed fracture: Secondary | ICD-10-CM | POA: Diagnosis not present

## 2022-02-02 DIAGNOSIS — M25612 Stiffness of left shoulder, not elsewhere classified: Secondary | ICD-10-CM | POA: Diagnosis not present

## 2022-02-02 DIAGNOSIS — M25512 Pain in left shoulder: Secondary | ICD-10-CM | POA: Diagnosis not present

## 2022-02-02 DIAGNOSIS — S42292D Other displaced fracture of upper end of left humerus, subsequent encounter for fracture with routine healing: Secondary | ICD-10-CM | POA: Diagnosis not present

## 2022-02-04 DIAGNOSIS — S42292D Other displaced fracture of upper end of left humerus, subsequent encounter for fracture with routine healing: Secondary | ICD-10-CM | POA: Diagnosis not present

## 2022-02-04 DIAGNOSIS — M25612 Stiffness of left shoulder, not elsewhere classified: Secondary | ICD-10-CM | POA: Diagnosis not present

## 2022-02-04 DIAGNOSIS — M25512 Pain in left shoulder: Secondary | ICD-10-CM | POA: Diagnosis not present

## 2022-02-08 DIAGNOSIS — M25512 Pain in left shoulder: Secondary | ICD-10-CM | POA: Diagnosis not present

## 2022-02-08 DIAGNOSIS — M25612 Stiffness of left shoulder, not elsewhere classified: Secondary | ICD-10-CM | POA: Diagnosis not present

## 2022-02-08 DIAGNOSIS — S42292A Other displaced fracture of upper end of left humerus, initial encounter for closed fracture: Secondary | ICD-10-CM | POA: Diagnosis not present

## 2022-02-08 DIAGNOSIS — S42292D Other displaced fracture of upper end of left humerus, subsequent encounter for fracture with routine healing: Secondary | ICD-10-CM | POA: Diagnosis not present

## 2022-02-11 DIAGNOSIS — S42292D Other displaced fracture of upper end of left humerus, subsequent encounter for fracture with routine healing: Secondary | ICD-10-CM | POA: Diagnosis not present

## 2022-02-11 DIAGNOSIS — M25612 Stiffness of left shoulder, not elsewhere classified: Secondary | ICD-10-CM | POA: Diagnosis not present

## 2022-02-11 DIAGNOSIS — M25512 Pain in left shoulder: Secondary | ICD-10-CM | POA: Diagnosis not present

## 2022-02-15 DIAGNOSIS — S42292D Other displaced fracture of upper end of left humerus, subsequent encounter for fracture with routine healing: Secondary | ICD-10-CM | POA: Diagnosis not present

## 2022-02-15 DIAGNOSIS — M25512 Pain in left shoulder: Secondary | ICD-10-CM | POA: Diagnosis not present

## 2022-02-15 DIAGNOSIS — M25612 Stiffness of left shoulder, not elsewhere classified: Secondary | ICD-10-CM | POA: Diagnosis not present

## 2022-02-23 DIAGNOSIS — M25512 Pain in left shoulder: Secondary | ICD-10-CM | POA: Diagnosis not present

## 2022-02-23 DIAGNOSIS — S42292D Other displaced fracture of upper end of left humerus, subsequent encounter for fracture with routine healing: Secondary | ICD-10-CM | POA: Diagnosis not present

## 2022-02-23 DIAGNOSIS — M25612 Stiffness of left shoulder, not elsewhere classified: Secondary | ICD-10-CM | POA: Diagnosis not present

## 2022-02-25 DIAGNOSIS — S42292D Other displaced fracture of upper end of left humerus, subsequent encounter for fracture with routine healing: Secondary | ICD-10-CM | POA: Diagnosis not present

## 2022-02-25 DIAGNOSIS — M25512 Pain in left shoulder: Secondary | ICD-10-CM | POA: Diagnosis not present

## 2022-02-25 DIAGNOSIS — M25612 Stiffness of left shoulder, not elsewhere classified: Secondary | ICD-10-CM | POA: Diagnosis not present

## 2022-03-02 DIAGNOSIS — M25512 Pain in left shoulder: Secondary | ICD-10-CM | POA: Diagnosis not present

## 2022-03-02 DIAGNOSIS — S42292D Other displaced fracture of upper end of left humerus, subsequent encounter for fracture with routine healing: Secondary | ICD-10-CM | POA: Diagnosis not present

## 2022-03-02 DIAGNOSIS — M25612 Stiffness of left shoulder, not elsewhere classified: Secondary | ICD-10-CM | POA: Diagnosis not present

## 2022-03-04 DIAGNOSIS — M25512 Pain in left shoulder: Secondary | ICD-10-CM | POA: Diagnosis not present

## 2022-03-04 DIAGNOSIS — M25612 Stiffness of left shoulder, not elsewhere classified: Secondary | ICD-10-CM | POA: Diagnosis not present

## 2022-03-04 DIAGNOSIS — S42292D Other displaced fracture of upper end of left humerus, subsequent encounter for fracture with routine healing: Secondary | ICD-10-CM | POA: Diagnosis not present

## 2022-03-08 DIAGNOSIS — S42292A Other displaced fracture of upper end of left humerus, initial encounter for closed fracture: Secondary | ICD-10-CM | POA: Diagnosis not present

## 2022-03-09 DIAGNOSIS — S42292D Other displaced fracture of upper end of left humerus, subsequent encounter for fracture with routine healing: Secondary | ICD-10-CM | POA: Diagnosis not present

## 2022-03-09 DIAGNOSIS — M25612 Stiffness of left shoulder, not elsewhere classified: Secondary | ICD-10-CM | POA: Diagnosis not present

## 2022-03-09 DIAGNOSIS — M25512 Pain in left shoulder: Secondary | ICD-10-CM | POA: Diagnosis not present

## 2022-03-11 DIAGNOSIS — M25512 Pain in left shoulder: Secondary | ICD-10-CM | POA: Diagnosis not present

## 2022-03-11 DIAGNOSIS — S42292D Other displaced fracture of upper end of left humerus, subsequent encounter for fracture with routine healing: Secondary | ICD-10-CM | POA: Diagnosis not present

## 2022-03-11 DIAGNOSIS — M25612 Stiffness of left shoulder, not elsewhere classified: Secondary | ICD-10-CM | POA: Diagnosis not present

## 2022-03-15 DIAGNOSIS — S42292D Other displaced fracture of upper end of left humerus, subsequent encounter for fracture with routine healing: Secondary | ICD-10-CM | POA: Diagnosis not present

## 2022-03-15 DIAGNOSIS — M25512 Pain in left shoulder: Secondary | ICD-10-CM | POA: Diagnosis not present

## 2022-03-15 DIAGNOSIS — M25612 Stiffness of left shoulder, not elsewhere classified: Secondary | ICD-10-CM | POA: Diagnosis not present

## 2022-03-17 DIAGNOSIS — M25512 Pain in left shoulder: Secondary | ICD-10-CM | POA: Diagnosis not present

## 2022-03-17 DIAGNOSIS — M25612 Stiffness of left shoulder, not elsewhere classified: Secondary | ICD-10-CM | POA: Diagnosis not present

## 2022-03-17 DIAGNOSIS — S42292D Other displaced fracture of upper end of left humerus, subsequent encounter for fracture with routine healing: Secondary | ICD-10-CM | POA: Diagnosis not present

## 2022-03-24 DIAGNOSIS — S42292D Other displaced fracture of upper end of left humerus, subsequent encounter for fracture with routine healing: Secondary | ICD-10-CM | POA: Diagnosis not present

## 2022-03-24 DIAGNOSIS — M25512 Pain in left shoulder: Secondary | ICD-10-CM | POA: Diagnosis not present

## 2022-03-24 DIAGNOSIS — M25612 Stiffness of left shoulder, not elsewhere classified: Secondary | ICD-10-CM | POA: Diagnosis not present

## 2022-03-26 DIAGNOSIS — M25512 Pain in left shoulder: Secondary | ICD-10-CM | POA: Diagnosis not present

## 2022-03-26 DIAGNOSIS — S42292D Other displaced fracture of upper end of left humerus, subsequent encounter for fracture with routine healing: Secondary | ICD-10-CM | POA: Diagnosis not present

## 2022-03-26 DIAGNOSIS — M25612 Stiffness of left shoulder, not elsewhere classified: Secondary | ICD-10-CM | POA: Diagnosis not present

## 2022-03-31 DIAGNOSIS — D239 Other benign neoplasm of skin, unspecified: Secondary | ICD-10-CM | POA: Diagnosis not present

## 2022-03-31 DIAGNOSIS — Z1283 Encounter for screening for malignant neoplasm of skin: Secondary | ICD-10-CM | POA: Diagnosis not present

## 2022-03-31 DIAGNOSIS — L570 Actinic keratosis: Secondary | ICD-10-CM | POA: Diagnosis not present

## 2022-04-02 DIAGNOSIS — S42292D Other displaced fracture of upper end of left humerus, subsequent encounter for fracture with routine healing: Secondary | ICD-10-CM | POA: Diagnosis not present

## 2022-04-02 DIAGNOSIS — M25512 Pain in left shoulder: Secondary | ICD-10-CM | POA: Diagnosis not present

## 2022-04-02 DIAGNOSIS — M25612 Stiffness of left shoulder, not elsewhere classified: Secondary | ICD-10-CM | POA: Diagnosis not present

## 2022-04-05 DIAGNOSIS — M25512 Pain in left shoulder: Secondary | ICD-10-CM | POA: Diagnosis not present

## 2022-04-05 DIAGNOSIS — S42292D Other displaced fracture of upper end of left humerus, subsequent encounter for fracture with routine healing: Secondary | ICD-10-CM | POA: Diagnosis not present

## 2022-04-05 DIAGNOSIS — M25612 Stiffness of left shoulder, not elsewhere classified: Secondary | ICD-10-CM | POA: Diagnosis not present

## 2022-04-07 DIAGNOSIS — M25512 Pain in left shoulder: Secondary | ICD-10-CM | POA: Diagnosis not present

## 2022-04-07 DIAGNOSIS — S42292D Other displaced fracture of upper end of left humerus, subsequent encounter for fracture with routine healing: Secondary | ICD-10-CM | POA: Diagnosis not present

## 2022-04-07 DIAGNOSIS — M25612 Stiffness of left shoulder, not elsewhere classified: Secondary | ICD-10-CM | POA: Diagnosis not present

## 2022-04-13 DIAGNOSIS — M25612 Stiffness of left shoulder, not elsewhere classified: Secondary | ICD-10-CM | POA: Diagnosis not present

## 2022-04-13 DIAGNOSIS — S42292D Other displaced fracture of upper end of left humerus, subsequent encounter for fracture with routine healing: Secondary | ICD-10-CM | POA: Diagnosis not present

## 2022-04-13 DIAGNOSIS — M25512 Pain in left shoulder: Secondary | ICD-10-CM | POA: Diagnosis not present

## 2022-04-15 DIAGNOSIS — S42292D Other displaced fracture of upper end of left humerus, subsequent encounter for fracture with routine healing: Secondary | ICD-10-CM | POA: Diagnosis not present

## 2022-04-15 DIAGNOSIS — M25612 Stiffness of left shoulder, not elsewhere classified: Secondary | ICD-10-CM | POA: Diagnosis not present

## 2022-04-15 DIAGNOSIS — M25512 Pain in left shoulder: Secondary | ICD-10-CM | POA: Diagnosis not present

## 2022-04-19 DIAGNOSIS — S42292A Other displaced fracture of upper end of left humerus, initial encounter for closed fracture: Secondary | ICD-10-CM | POA: Diagnosis not present

## 2022-04-20 DIAGNOSIS — M25612 Stiffness of left shoulder, not elsewhere classified: Secondary | ICD-10-CM | POA: Diagnosis not present

## 2022-04-20 DIAGNOSIS — S42292D Other displaced fracture of upper end of left humerus, subsequent encounter for fracture with routine healing: Secondary | ICD-10-CM | POA: Diagnosis not present

## 2022-04-20 DIAGNOSIS — M25512 Pain in left shoulder: Secondary | ICD-10-CM | POA: Diagnosis not present

## 2022-04-22 DIAGNOSIS — S42292D Other displaced fracture of upper end of left humerus, subsequent encounter for fracture with routine healing: Secondary | ICD-10-CM | POA: Diagnosis not present

## 2022-04-22 DIAGNOSIS — M25512 Pain in left shoulder: Secondary | ICD-10-CM | POA: Diagnosis not present

## 2022-04-22 DIAGNOSIS — M25612 Stiffness of left shoulder, not elsewhere classified: Secondary | ICD-10-CM | POA: Diagnosis not present

## 2022-04-26 DIAGNOSIS — R059 Cough, unspecified: Secondary | ICD-10-CM | POA: Diagnosis not present

## 2022-04-26 DIAGNOSIS — R030 Elevated blood-pressure reading, without diagnosis of hypertension: Secondary | ICD-10-CM | POA: Diagnosis not present

## 2022-04-26 DIAGNOSIS — J329 Chronic sinusitis, unspecified: Secondary | ICD-10-CM | POA: Diagnosis not present

## 2022-04-26 DIAGNOSIS — Z6823 Body mass index (BMI) 23.0-23.9, adult: Secondary | ICD-10-CM | POA: Diagnosis not present

## 2022-04-26 DIAGNOSIS — J029 Acute pharyngitis, unspecified: Secondary | ICD-10-CM | POA: Diagnosis not present

## 2022-04-26 DIAGNOSIS — Z20828 Contact with and (suspected) exposure to other viral communicable diseases: Secondary | ICD-10-CM | POA: Diagnosis not present

## 2022-05-14 DIAGNOSIS — Z961 Presence of intraocular lens: Secondary | ICD-10-CM | POA: Diagnosis not present

## 2022-05-14 DIAGNOSIS — Z970 Presence of artificial eye: Secondary | ICD-10-CM | POA: Diagnosis not present

## 2022-06-28 DIAGNOSIS — Z0001 Encounter for general adult medical examination with abnormal findings: Secondary | ICD-10-CM | POA: Diagnosis not present

## 2022-06-28 DIAGNOSIS — E559 Vitamin D deficiency, unspecified: Secondary | ICD-10-CM | POA: Diagnosis not present

## 2022-06-28 DIAGNOSIS — R7301 Impaired fasting glucose: Secondary | ICD-10-CM | POA: Diagnosis not present

## 2022-06-28 DIAGNOSIS — E7849 Other hyperlipidemia: Secondary | ICD-10-CM | POA: Diagnosis not present

## 2022-06-28 DIAGNOSIS — I10 Essential (primary) hypertension: Secondary | ICD-10-CM | POA: Diagnosis not present

## 2022-06-28 DIAGNOSIS — E7801 Familial hypercholesterolemia: Secondary | ICD-10-CM | POA: Diagnosis not present

## 2022-06-28 DIAGNOSIS — Z1329 Encounter for screening for other suspected endocrine disorder: Secondary | ICD-10-CM | POA: Diagnosis not present

## 2022-07-01 DIAGNOSIS — R7301 Impaired fasting glucose: Secondary | ICD-10-CM | POA: Diagnosis not present

## 2022-07-01 DIAGNOSIS — F331 Major depressive disorder, recurrent, moderate: Secondary | ICD-10-CM | POA: Diagnosis not present

## 2022-07-01 DIAGNOSIS — F419 Anxiety disorder, unspecified: Secondary | ICD-10-CM | POA: Diagnosis not present

## 2022-07-01 DIAGNOSIS — Z23 Encounter for immunization: Secondary | ICD-10-CM | POA: Diagnosis not present

## 2022-07-01 DIAGNOSIS — Z0001 Encounter for general adult medical examination with abnormal findings: Secondary | ICD-10-CM | POA: Diagnosis not present

## 2022-07-01 DIAGNOSIS — N1831 Chronic kidney disease, stage 3a: Secondary | ICD-10-CM | POA: Diagnosis not present

## 2022-07-01 DIAGNOSIS — I10 Essential (primary) hypertension: Secondary | ICD-10-CM | POA: Diagnosis not present

## 2022-07-01 DIAGNOSIS — Z6823 Body mass index (BMI) 23.0-23.9, adult: Secondary | ICD-10-CM | POA: Diagnosis not present

## 2022-07-01 DIAGNOSIS — E7849 Other hyperlipidemia: Secondary | ICD-10-CM | POA: Diagnosis not present

## 2022-07-02 ENCOUNTER — Other Ambulatory Visit: Payer: Self-pay | Admitting: Family Medicine

## 2022-07-02 DIAGNOSIS — R42 Dizziness and giddiness: Secondary | ICD-10-CM

## 2022-07-02 DIAGNOSIS — R2681 Unsteadiness on feet: Secondary | ICD-10-CM

## 2022-07-02 DIAGNOSIS — R296 Repeated falls: Secondary | ICD-10-CM

## 2022-07-07 ENCOUNTER — Ambulatory Visit
Admission: RE | Admit: 2022-07-07 | Discharge: 2022-07-07 | Disposition: A | Discharge status: Home / Self Care | Payer: Medicare PPO | Source: Ambulatory Visit | Attending: Family Medicine | Admitting: Family Medicine

## 2022-07-07 DIAGNOSIS — R296 Repeated falls: Secondary | ICD-10-CM

## 2022-07-07 DIAGNOSIS — R42 Dizziness and giddiness: Secondary | ICD-10-CM

## 2022-07-07 DIAGNOSIS — R2681 Unsteadiness on feet: Secondary | ICD-10-CM

## 2022-07-16 DIAGNOSIS — M8589 Other specified disorders of bone density and structure, multiple sites: Secondary | ICD-10-CM | POA: Diagnosis not present

## 2022-07-16 DIAGNOSIS — M810 Age-related osteoporosis without current pathological fracture: Secondary | ICD-10-CM | POA: Diagnosis not present

## 2022-08-23 DIAGNOSIS — B9689 Other specified bacterial agents as the cause of diseases classified elsewhere: Secondary | ICD-10-CM | POA: Diagnosis not present

## 2022-08-23 DIAGNOSIS — J329 Chronic sinusitis, unspecified: Secondary | ICD-10-CM | POA: Diagnosis not present

## 2022-09-04 DIAGNOSIS — Z6823 Body mass index (BMI) 23.0-23.9, adult: Secondary | ICD-10-CM | POA: Diagnosis not present

## 2022-09-04 DIAGNOSIS — H00039 Abscess of eyelid unspecified eye, unspecified eyelid: Secondary | ICD-10-CM | POA: Diagnosis not present

## 2022-09-04 DIAGNOSIS — R030 Elevated blood-pressure reading, without diagnosis of hypertension: Secondary | ICD-10-CM | POA: Diagnosis not present

## 2022-09-15 DIAGNOSIS — R9431 Abnormal electrocardiogram [ECG] [EKG]: Secondary | ICD-10-CM | POA: Diagnosis not present

## 2022-09-15 DIAGNOSIS — R079 Chest pain, unspecified: Secondary | ICD-10-CM | POA: Diagnosis not present

## 2022-09-15 DIAGNOSIS — I700 Atherosclerosis of aorta: Secondary | ICD-10-CM | POA: Diagnosis not present

## 2022-09-15 DIAGNOSIS — N644 Mastodynia: Secondary | ICD-10-CM | POA: Diagnosis not present

## 2022-09-15 DIAGNOSIS — K7689 Other specified diseases of liver: Secondary | ICD-10-CM | POA: Diagnosis not present

## 2022-09-15 DIAGNOSIS — M47816 Spondylosis without myelopathy or radiculopathy, lumbar region: Secondary | ICD-10-CM | POA: Diagnosis not present

## 2022-09-15 DIAGNOSIS — N281 Cyst of kidney, acquired: Secondary | ICD-10-CM | POA: Diagnosis not present

## 2022-09-15 DIAGNOSIS — R1011 Right upper quadrant pain: Secondary | ICD-10-CM | POA: Diagnosis not present

## 2022-09-15 DIAGNOSIS — F419 Anxiety disorder, unspecified: Secondary | ICD-10-CM | POA: Diagnosis not present

## 2022-09-20 DIAGNOSIS — R030 Elevated blood-pressure reading, without diagnosis of hypertension: Secondary | ICD-10-CM | POA: Diagnosis not present

## 2022-09-20 DIAGNOSIS — S29019A Strain of muscle and tendon of unspecified wall of thorax, initial encounter: Secondary | ICD-10-CM | POA: Diagnosis not present

## 2022-09-20 DIAGNOSIS — Z6823 Body mass index (BMI) 23.0-23.9, adult: Secondary | ICD-10-CM | POA: Diagnosis not present

## 2022-10-11 DIAGNOSIS — Z1231 Encounter for screening mammogram for malignant neoplasm of breast: Secondary | ICD-10-CM | POA: Diagnosis not present

## 2022-10-27 DIAGNOSIS — R928 Other abnormal and inconclusive findings on diagnostic imaging of breast: Secondary | ICD-10-CM | POA: Diagnosis not present

## 2022-10-27 DIAGNOSIS — R92333 Mammographic heterogeneous density, bilateral breasts: Secondary | ICD-10-CM | POA: Diagnosis not present

## 2022-12-27 DIAGNOSIS — N1831 Chronic kidney disease, stage 3a: Secondary | ICD-10-CM | POA: Diagnosis not present

## 2022-12-27 DIAGNOSIS — E7801 Familial hypercholesterolemia: Secondary | ICD-10-CM | POA: Diagnosis not present

## 2022-12-27 DIAGNOSIS — E7849 Other hyperlipidemia: Secondary | ICD-10-CM | POA: Diagnosis not present

## 2022-12-27 DIAGNOSIS — Z1329 Encounter for screening for other suspected endocrine disorder: Secondary | ICD-10-CM | POA: Diagnosis not present

## 2022-12-30 DIAGNOSIS — Z23 Encounter for immunization: Secondary | ICD-10-CM | POA: Diagnosis not present

## 2022-12-30 DIAGNOSIS — R4582 Worries: Secondary | ICD-10-CM | POA: Diagnosis not present

## 2022-12-30 DIAGNOSIS — N1831 Chronic kidney disease, stage 3a: Secondary | ICD-10-CM | POA: Diagnosis not present

## 2022-12-30 DIAGNOSIS — E7849 Other hyperlipidemia: Secondary | ICD-10-CM | POA: Diagnosis not present

## 2022-12-30 DIAGNOSIS — R7301 Impaired fasting glucose: Secondary | ICD-10-CM | POA: Diagnosis not present

## 2022-12-30 DIAGNOSIS — F419 Anxiety disorder, unspecified: Secondary | ICD-10-CM | POA: Diagnosis not present

## 2022-12-30 DIAGNOSIS — I10 Essential (primary) hypertension: Secondary | ICD-10-CM | POA: Diagnosis not present

## 2022-12-30 DIAGNOSIS — Z6823 Body mass index (BMI) 23.0-23.9, adult: Secondary | ICD-10-CM | POA: Diagnosis not present

## 2022-12-30 DIAGNOSIS — F331 Major depressive disorder, recurrent, moderate: Secondary | ICD-10-CM | POA: Diagnosis not present

## 2023-04-02 DIAGNOSIS — Z6824 Body mass index (BMI) 24.0-24.9, adult: Secondary | ICD-10-CM | POA: Diagnosis not present

## 2023-04-02 DIAGNOSIS — J0101 Acute recurrent maxillary sinusitis: Secondary | ICD-10-CM | POA: Diagnosis not present

## 2023-04-02 DIAGNOSIS — R030 Elevated blood-pressure reading, without diagnosis of hypertension: Secondary | ICD-10-CM | POA: Diagnosis not present

## 2023-04-02 DIAGNOSIS — R059 Cough, unspecified: Secondary | ICD-10-CM | POA: Diagnosis not present

## 2023-04-02 DIAGNOSIS — S29012A Strain of muscle and tendon of back wall of thorax, initial encounter: Secondary | ICD-10-CM | POA: Diagnosis not present

## 2023-05-16 DIAGNOSIS — M25512 Pain in left shoulder: Secondary | ICD-10-CM | POA: Diagnosis not present

## 2023-05-16 DIAGNOSIS — S42292P Other displaced fracture of upper end of left humerus, subsequent encounter for fracture with malunion: Secondary | ICD-10-CM | POA: Diagnosis not present

## 2023-05-16 DIAGNOSIS — M542 Cervicalgia: Secondary | ICD-10-CM | POA: Diagnosis not present

## 2023-05-23 DIAGNOSIS — M25512 Pain in left shoulder: Secondary | ICD-10-CM | POA: Diagnosis not present

## 2023-05-23 DIAGNOSIS — M19012 Primary osteoarthritis, left shoulder: Secondary | ICD-10-CM | POA: Diagnosis not present

## 2023-05-23 DIAGNOSIS — I700 Atherosclerosis of aorta: Secondary | ICD-10-CM | POA: Diagnosis not present

## 2023-05-23 DIAGNOSIS — Z96612 Presence of left artificial shoulder joint: Secondary | ICD-10-CM | POA: Diagnosis not present

## 2023-05-23 DIAGNOSIS — Z01818 Encounter for other preprocedural examination: Secondary | ICD-10-CM | POA: Diagnosis not present

## 2023-06-02 DIAGNOSIS — M542 Cervicalgia: Secondary | ICD-10-CM | POA: Diagnosis not present

## 2023-06-02 DIAGNOSIS — Z01818 Encounter for other preprocedural examination: Secondary | ICD-10-CM | POA: Diagnosis not present

## 2023-06-02 DIAGNOSIS — M2550 Pain in unspecified joint: Secondary | ICD-10-CM | POA: Diagnosis not present

## 2023-06-02 DIAGNOSIS — M25512 Pain in left shoulder: Secondary | ICD-10-CM | POA: Diagnosis not present

## 2023-06-06 DIAGNOSIS — Z01818 Encounter for other preprocedural examination: Secondary | ICD-10-CM | POA: Diagnosis not present

## 2023-06-06 DIAGNOSIS — S42292P Other displaced fracture of upper end of left humerus, subsequent encounter for fracture with malunion: Secondary | ICD-10-CM | POA: Diagnosis not present

## 2023-06-06 DIAGNOSIS — M2550 Pain in unspecified joint: Secondary | ICD-10-CM | POA: Diagnosis not present

## 2023-06-06 DIAGNOSIS — M25512 Pain in left shoulder: Secondary | ICD-10-CM | POA: Diagnosis not present

## 2023-06-06 DIAGNOSIS — M542 Cervicalgia: Secondary | ICD-10-CM | POA: Diagnosis not present

## 2023-06-09 DIAGNOSIS — Z01818 Encounter for other preprocedural examination: Secondary | ICD-10-CM | POA: Diagnosis not present

## 2023-06-15 DIAGNOSIS — M1712 Unilateral primary osteoarthritis, left knee: Secondary | ICD-10-CM | POA: Diagnosis not present

## 2023-06-15 DIAGNOSIS — S42202A Unspecified fracture of upper end of left humerus, initial encounter for closed fracture: Secondary | ICD-10-CM | POA: Diagnosis not present

## 2023-06-15 DIAGNOSIS — R2681 Unsteadiness on feet: Secondary | ICD-10-CM | POA: Diagnosis not present

## 2023-06-15 DIAGNOSIS — E7800 Pure hypercholesterolemia, unspecified: Secondary | ICD-10-CM | POA: Diagnosis not present

## 2023-06-15 DIAGNOSIS — Z79891 Long term (current) use of opiate analgesic: Secondary | ICD-10-CM | POA: Diagnosis not present

## 2023-06-15 DIAGNOSIS — I10 Essential (primary) hypertension: Secondary | ICD-10-CM | POA: Diagnosis not present

## 2023-06-15 DIAGNOSIS — S4292XA Fracture of left shoulder girdle, part unspecified, initial encounter for closed fracture: Secondary | ICD-10-CM | POA: Diagnosis not present

## 2023-06-15 DIAGNOSIS — I700 Atherosclerosis of aorta: Secondary | ICD-10-CM | POA: Diagnosis not present

## 2023-06-15 DIAGNOSIS — Z90710 Acquired absence of both cervix and uterus: Secondary | ICD-10-CM | POA: Diagnosis not present

## 2023-06-15 DIAGNOSIS — S42292A Other displaced fracture of upper end of left humerus, initial encounter for closed fracture: Secondary | ICD-10-CM | POA: Diagnosis not present

## 2023-06-15 DIAGNOSIS — M6281 Muscle weakness (generalized): Secondary | ICD-10-CM | POA: Diagnosis not present

## 2023-06-15 DIAGNOSIS — Z96612 Presence of left artificial shoulder joint: Secondary | ICD-10-CM | POA: Diagnosis not present

## 2023-06-16 DIAGNOSIS — I10 Essential (primary) hypertension: Secondary | ICD-10-CM | POA: Diagnosis not present

## 2023-06-16 DIAGNOSIS — Z90710 Acquired absence of both cervix and uterus: Secondary | ICD-10-CM | POA: Diagnosis not present

## 2023-06-16 DIAGNOSIS — R2681 Unsteadiness on feet: Secondary | ICD-10-CM | POA: Diagnosis not present

## 2023-06-16 DIAGNOSIS — Z96612 Presence of left artificial shoulder joint: Secondary | ICD-10-CM | POA: Diagnosis not present

## 2023-06-16 DIAGNOSIS — Z79891 Long term (current) use of opiate analgesic: Secondary | ICD-10-CM | POA: Diagnosis not present

## 2023-06-16 DIAGNOSIS — E7800 Pure hypercholesterolemia, unspecified: Secondary | ICD-10-CM | POA: Diagnosis not present

## 2023-06-16 DIAGNOSIS — I700 Atherosclerosis of aorta: Secondary | ICD-10-CM | POA: Diagnosis not present

## 2023-06-16 DIAGNOSIS — M6281 Muscle weakness (generalized): Secondary | ICD-10-CM | POA: Diagnosis not present

## 2023-06-16 DIAGNOSIS — S4292XA Fracture of left shoulder girdle, part unspecified, initial encounter for closed fracture: Secondary | ICD-10-CM | POA: Diagnosis not present

## 2023-06-30 DIAGNOSIS — Z96612 Presence of left artificial shoulder joint: Secondary | ICD-10-CM | POA: Diagnosis not present

## 2023-06-30 DIAGNOSIS — Z471 Aftercare following joint replacement surgery: Secondary | ICD-10-CM | POA: Diagnosis not present

## 2023-06-30 DIAGNOSIS — S42292P Other displaced fracture of upper end of left humerus, subsequent encounter for fracture with malunion: Secondary | ICD-10-CM | POA: Diagnosis not present

## 2023-06-30 DIAGNOSIS — Z90710 Acquired absence of both cervix and uterus: Secondary | ICD-10-CM | POA: Diagnosis not present

## 2023-06-30 DIAGNOSIS — Z09 Encounter for follow-up examination after completed treatment for conditions other than malignant neoplasm: Secondary | ICD-10-CM | POA: Diagnosis not present

## 2023-06-30 DIAGNOSIS — I10 Essential (primary) hypertension: Secondary | ICD-10-CM | POA: Diagnosis not present

## 2023-06-30 DIAGNOSIS — Z7982 Long term (current) use of aspirin: Secondary | ICD-10-CM | POA: Diagnosis not present

## 2023-06-30 DIAGNOSIS — E7800 Pure hypercholesterolemia, unspecified: Secondary | ICD-10-CM | POA: Diagnosis not present

## 2023-07-05 DIAGNOSIS — Z1329 Encounter for screening for other suspected endocrine disorder: Secondary | ICD-10-CM | POA: Diagnosis not present

## 2023-07-05 DIAGNOSIS — E782 Mixed hyperlipidemia: Secondary | ICD-10-CM | POA: Diagnosis not present

## 2023-07-05 DIAGNOSIS — N1831 Chronic kidney disease, stage 3a: Secondary | ICD-10-CM | POA: Diagnosis not present

## 2023-07-05 DIAGNOSIS — I10 Essential (primary) hypertension: Secondary | ICD-10-CM | POA: Diagnosis not present

## 2023-07-05 DIAGNOSIS — R739 Hyperglycemia, unspecified: Secondary | ICD-10-CM | POA: Diagnosis not present

## 2023-07-11 DIAGNOSIS — I10 Essential (primary) hypertension: Secondary | ICD-10-CM | POA: Diagnosis not present

## 2023-07-11 DIAGNOSIS — Z0001 Encounter for general adult medical examination with abnormal findings: Secondary | ICD-10-CM | POA: Diagnosis not present

## 2023-07-11 DIAGNOSIS — Z1389 Encounter for screening for other disorder: Secondary | ICD-10-CM | POA: Diagnosis not present

## 2023-07-11 DIAGNOSIS — N1831 Chronic kidney disease, stage 3a: Secondary | ICD-10-CM | POA: Diagnosis not present

## 2023-07-11 DIAGNOSIS — Z1331 Encounter for screening for depression: Secondary | ICD-10-CM | POA: Diagnosis not present

## 2023-07-11 DIAGNOSIS — E782 Mixed hyperlipidemia: Secondary | ICD-10-CM | POA: Diagnosis not present

## 2023-07-11 DIAGNOSIS — F331 Major depressive disorder, recurrent, moderate: Secondary | ICD-10-CM | POA: Diagnosis not present

## 2023-07-11 DIAGNOSIS — Z6824 Body mass index (BMI) 24.0-24.9, adult: Secondary | ICD-10-CM | POA: Diagnosis not present

## 2023-07-28 DIAGNOSIS — Z96612 Presence of left artificial shoulder joint: Secondary | ICD-10-CM | POA: Diagnosis not present

## 2023-08-23 DIAGNOSIS — R5383 Other fatigue: Secondary | ICD-10-CM | POA: Diagnosis not present

## 2023-08-23 DIAGNOSIS — N1831 Chronic kidney disease, stage 3a: Secondary | ICD-10-CM | POA: Diagnosis not present

## 2023-08-23 DIAGNOSIS — I10 Essential (primary) hypertension: Secondary | ICD-10-CM | POA: Diagnosis not present

## 2023-08-31 DIAGNOSIS — H524 Presbyopia: Secondary | ICD-10-CM | POA: Diagnosis not present

## 2023-08-31 DIAGNOSIS — Z961 Presence of intraocular lens: Secondary | ICD-10-CM | POA: Diagnosis not present

## 2023-08-31 DIAGNOSIS — H5202 Hypermetropia, left eye: Secondary | ICD-10-CM | POA: Diagnosis not present

## 2023-08-31 DIAGNOSIS — Z970 Presence of artificial eye: Secondary | ICD-10-CM | POA: Diagnosis not present

## 2023-08-31 DIAGNOSIS — H52222 Regular astigmatism, left eye: Secondary | ICD-10-CM | POA: Diagnosis not present

## 2023-09-01 DIAGNOSIS — I781 Nevus, non-neoplastic: Secondary | ICD-10-CM | POA: Diagnosis not present

## 2023-09-01 DIAGNOSIS — L570 Actinic keratosis: Secondary | ICD-10-CM | POA: Diagnosis not present

## 2023-09-01 DIAGNOSIS — L814 Other melanin hyperpigmentation: Secondary | ICD-10-CM | POA: Diagnosis not present

## 2023-09-01 DIAGNOSIS — D1801 Hemangioma of skin and subcutaneous tissue: Secondary | ICD-10-CM | POA: Diagnosis not present

## 2023-09-08 DIAGNOSIS — Z96612 Presence of left artificial shoulder joint: Secondary | ICD-10-CM | POA: Diagnosis not present

## 2023-09-16 DIAGNOSIS — S42292A Other displaced fracture of upper end of left humerus, initial encounter for closed fracture: Secondary | ICD-10-CM | POA: Diagnosis not present

## 2023-09-16 DIAGNOSIS — M25512 Pain in left shoulder: Secondary | ICD-10-CM | POA: Diagnosis not present

## 2023-09-16 DIAGNOSIS — Z96612 Presence of left artificial shoulder joint: Secondary | ICD-10-CM | POA: Diagnosis not present

## 2023-09-16 DIAGNOSIS — M25612 Stiffness of left shoulder, not elsewhere classified: Secondary | ICD-10-CM | POA: Diagnosis not present

## 2023-09-20 DIAGNOSIS — S42292A Other displaced fracture of upper end of left humerus, initial encounter for closed fracture: Secondary | ICD-10-CM | POA: Diagnosis not present

## 2023-09-20 DIAGNOSIS — M25512 Pain in left shoulder: Secondary | ICD-10-CM | POA: Diagnosis not present

## 2023-09-20 DIAGNOSIS — Z96612 Presence of left artificial shoulder joint: Secondary | ICD-10-CM | POA: Diagnosis not present

## 2023-09-20 DIAGNOSIS — M25612 Stiffness of left shoulder, not elsewhere classified: Secondary | ICD-10-CM | POA: Diagnosis not present

## 2023-09-22 DIAGNOSIS — S42292A Other displaced fracture of upper end of left humerus, initial encounter for closed fracture: Secondary | ICD-10-CM | POA: Diagnosis not present

## 2023-09-22 DIAGNOSIS — M25612 Stiffness of left shoulder, not elsewhere classified: Secondary | ICD-10-CM | POA: Diagnosis not present

## 2023-09-22 DIAGNOSIS — Z96612 Presence of left artificial shoulder joint: Secondary | ICD-10-CM | POA: Diagnosis not present

## 2023-09-22 DIAGNOSIS — M25512 Pain in left shoulder: Secondary | ICD-10-CM | POA: Diagnosis not present

## 2023-09-28 DIAGNOSIS — Z96612 Presence of left artificial shoulder joint: Secondary | ICD-10-CM | POA: Diagnosis not present

## 2023-09-28 DIAGNOSIS — M25612 Stiffness of left shoulder, not elsewhere classified: Secondary | ICD-10-CM | POA: Diagnosis not present

## 2023-09-28 DIAGNOSIS — S42292A Other displaced fracture of upper end of left humerus, initial encounter for closed fracture: Secondary | ICD-10-CM | POA: Diagnosis not present

## 2023-09-28 DIAGNOSIS — M25512 Pain in left shoulder: Secondary | ICD-10-CM | POA: Diagnosis not present

## 2023-10-04 DIAGNOSIS — S42292A Other displaced fracture of upper end of left humerus, initial encounter for closed fracture: Secondary | ICD-10-CM | POA: Diagnosis not present

## 2023-10-04 DIAGNOSIS — M25512 Pain in left shoulder: Secondary | ICD-10-CM | POA: Diagnosis not present

## 2023-10-04 DIAGNOSIS — Z96612 Presence of left artificial shoulder joint: Secondary | ICD-10-CM | POA: Diagnosis not present

## 2023-10-04 DIAGNOSIS — M25612 Stiffness of left shoulder, not elsewhere classified: Secondary | ICD-10-CM | POA: Diagnosis not present

## 2023-10-06 DIAGNOSIS — S42292A Other displaced fracture of upper end of left humerus, initial encounter for closed fracture: Secondary | ICD-10-CM | POA: Diagnosis not present

## 2023-10-06 DIAGNOSIS — M25512 Pain in left shoulder: Secondary | ICD-10-CM | POA: Diagnosis not present

## 2023-10-06 DIAGNOSIS — M25612 Stiffness of left shoulder, not elsewhere classified: Secondary | ICD-10-CM | POA: Diagnosis not present

## 2023-10-06 DIAGNOSIS — Z96612 Presence of left artificial shoulder joint: Secondary | ICD-10-CM | POA: Diagnosis not present

## 2023-10-12 DIAGNOSIS — S42292A Other displaced fracture of upper end of left humerus, initial encounter for closed fracture: Secondary | ICD-10-CM | POA: Diagnosis not present

## 2023-10-12 DIAGNOSIS — M25612 Stiffness of left shoulder, not elsewhere classified: Secondary | ICD-10-CM | POA: Diagnosis not present

## 2023-10-12 DIAGNOSIS — M25512 Pain in left shoulder: Secondary | ICD-10-CM | POA: Diagnosis not present

## 2023-10-12 DIAGNOSIS — Z96612 Presence of left artificial shoulder joint: Secondary | ICD-10-CM | POA: Diagnosis not present

## 2023-10-13 DIAGNOSIS — S42292A Other displaced fracture of upper end of left humerus, initial encounter for closed fracture: Secondary | ICD-10-CM | POA: Diagnosis not present

## 2023-10-13 DIAGNOSIS — M25612 Stiffness of left shoulder, not elsewhere classified: Secondary | ICD-10-CM | POA: Diagnosis not present

## 2023-10-13 DIAGNOSIS — Z96612 Presence of left artificial shoulder joint: Secondary | ICD-10-CM | POA: Diagnosis not present

## 2023-10-13 DIAGNOSIS — M25512 Pain in left shoulder: Secondary | ICD-10-CM | POA: Diagnosis not present

## 2023-10-17 DIAGNOSIS — M25612 Stiffness of left shoulder, not elsewhere classified: Secondary | ICD-10-CM | POA: Diagnosis not present

## 2023-10-17 DIAGNOSIS — M25512 Pain in left shoulder: Secondary | ICD-10-CM | POA: Diagnosis not present

## 2023-10-17 DIAGNOSIS — Z96612 Presence of left artificial shoulder joint: Secondary | ICD-10-CM | POA: Diagnosis not present

## 2023-10-17 DIAGNOSIS — S42292A Other displaced fracture of upper end of left humerus, initial encounter for closed fracture: Secondary | ICD-10-CM | POA: Diagnosis not present

## 2023-10-20 DIAGNOSIS — M25512 Pain in left shoulder: Secondary | ICD-10-CM | POA: Diagnosis not present

## 2023-10-20 DIAGNOSIS — S42292A Other displaced fracture of upper end of left humerus, initial encounter for closed fracture: Secondary | ICD-10-CM | POA: Diagnosis not present

## 2023-10-20 DIAGNOSIS — Z96612 Presence of left artificial shoulder joint: Secondary | ICD-10-CM | POA: Diagnosis not present

## 2023-10-20 DIAGNOSIS — M25612 Stiffness of left shoulder, not elsewhere classified: Secondary | ICD-10-CM | POA: Diagnosis not present

## 2023-10-26 DIAGNOSIS — M25612 Stiffness of left shoulder, not elsewhere classified: Secondary | ICD-10-CM | POA: Diagnosis not present

## 2023-10-26 DIAGNOSIS — S42292A Other displaced fracture of upper end of left humerus, initial encounter for closed fracture: Secondary | ICD-10-CM | POA: Diagnosis not present

## 2023-10-26 DIAGNOSIS — M25512 Pain in left shoulder: Secondary | ICD-10-CM | POA: Diagnosis not present

## 2023-10-26 DIAGNOSIS — Z96612 Presence of left artificial shoulder joint: Secondary | ICD-10-CM | POA: Diagnosis not present

## 2023-10-28 DIAGNOSIS — M25612 Stiffness of left shoulder, not elsewhere classified: Secondary | ICD-10-CM | POA: Diagnosis not present

## 2023-10-28 DIAGNOSIS — M25512 Pain in left shoulder: Secondary | ICD-10-CM | POA: Diagnosis not present

## 2023-10-28 DIAGNOSIS — Z96612 Presence of left artificial shoulder joint: Secondary | ICD-10-CM | POA: Diagnosis not present

## 2023-10-28 DIAGNOSIS — S42292A Other displaced fracture of upper end of left humerus, initial encounter for closed fracture: Secondary | ICD-10-CM | POA: Diagnosis not present

## 2023-10-31 DIAGNOSIS — M25612 Stiffness of left shoulder, not elsewhere classified: Secondary | ICD-10-CM | POA: Diagnosis not present

## 2023-10-31 DIAGNOSIS — M25512 Pain in left shoulder: Secondary | ICD-10-CM | POA: Diagnosis not present

## 2023-10-31 DIAGNOSIS — Z96612 Presence of left artificial shoulder joint: Secondary | ICD-10-CM | POA: Diagnosis not present

## 2023-10-31 DIAGNOSIS — S42292A Other displaced fracture of upper end of left humerus, initial encounter for closed fracture: Secondary | ICD-10-CM | POA: Diagnosis not present

## 2023-11-03 DIAGNOSIS — Z96612 Presence of left artificial shoulder joint: Secondary | ICD-10-CM | POA: Diagnosis not present

## 2023-11-03 DIAGNOSIS — M25512 Pain in left shoulder: Secondary | ICD-10-CM | POA: Diagnosis not present

## 2023-11-03 DIAGNOSIS — M25612 Stiffness of left shoulder, not elsewhere classified: Secondary | ICD-10-CM | POA: Diagnosis not present

## 2023-11-03 DIAGNOSIS — S42292A Other displaced fracture of upper end of left humerus, initial encounter for closed fracture: Secondary | ICD-10-CM | POA: Diagnosis not present

## 2023-11-11 DIAGNOSIS — M25512 Pain in left shoulder: Secondary | ICD-10-CM | POA: Diagnosis not present

## 2023-11-11 DIAGNOSIS — M25612 Stiffness of left shoulder, not elsewhere classified: Secondary | ICD-10-CM | POA: Diagnosis not present

## 2023-11-11 DIAGNOSIS — S42292A Other displaced fracture of upper end of left humerus, initial encounter for closed fracture: Secondary | ICD-10-CM | POA: Diagnosis not present

## 2023-11-11 DIAGNOSIS — Z96612 Presence of left artificial shoulder joint: Secondary | ICD-10-CM | POA: Diagnosis not present

## 2023-11-14 DIAGNOSIS — M25612 Stiffness of left shoulder, not elsewhere classified: Secondary | ICD-10-CM | POA: Diagnosis not present

## 2023-11-14 DIAGNOSIS — S42292A Other displaced fracture of upper end of left humerus, initial encounter for closed fracture: Secondary | ICD-10-CM | POA: Diagnosis not present

## 2023-11-14 DIAGNOSIS — Z96612 Presence of left artificial shoulder joint: Secondary | ICD-10-CM | POA: Diagnosis not present

## 2023-11-14 DIAGNOSIS — M25512 Pain in left shoulder: Secondary | ICD-10-CM | POA: Diagnosis not present

## 2023-11-17 DIAGNOSIS — M25512 Pain in left shoulder: Secondary | ICD-10-CM | POA: Diagnosis not present

## 2023-11-17 DIAGNOSIS — M25612 Stiffness of left shoulder, not elsewhere classified: Secondary | ICD-10-CM | POA: Diagnosis not present

## 2023-11-17 DIAGNOSIS — Z96612 Presence of left artificial shoulder joint: Secondary | ICD-10-CM | POA: Diagnosis not present

## 2023-11-17 DIAGNOSIS — S42292A Other displaced fracture of upper end of left humerus, initial encounter for closed fracture: Secondary | ICD-10-CM | POA: Diagnosis not present

## 2023-11-21 DIAGNOSIS — S42292A Other displaced fracture of upper end of left humerus, initial encounter for closed fracture: Secondary | ICD-10-CM | POA: Diagnosis not present

## 2023-11-21 DIAGNOSIS — M25512 Pain in left shoulder: Secondary | ICD-10-CM | POA: Diagnosis not present

## 2023-11-21 DIAGNOSIS — M25612 Stiffness of left shoulder, not elsewhere classified: Secondary | ICD-10-CM | POA: Diagnosis not present

## 2023-11-21 DIAGNOSIS — Z96612 Presence of left artificial shoulder joint: Secondary | ICD-10-CM | POA: Diagnosis not present

## 2023-11-30 DIAGNOSIS — S42292A Other displaced fracture of upper end of left humerus, initial encounter for closed fracture: Secondary | ICD-10-CM | POA: Diagnosis not present

## 2023-11-30 DIAGNOSIS — Z96612 Presence of left artificial shoulder joint: Secondary | ICD-10-CM | POA: Diagnosis not present

## 2023-11-30 DIAGNOSIS — M25612 Stiffness of left shoulder, not elsewhere classified: Secondary | ICD-10-CM | POA: Diagnosis not present

## 2023-11-30 DIAGNOSIS — M25512 Pain in left shoulder: Secondary | ICD-10-CM | POA: Diagnosis not present

## 2023-12-01 DIAGNOSIS — S42292A Other displaced fracture of upper end of left humerus, initial encounter for closed fracture: Secondary | ICD-10-CM | POA: Diagnosis not present

## 2023-12-01 DIAGNOSIS — Z96612 Presence of left artificial shoulder joint: Secondary | ICD-10-CM | POA: Diagnosis not present

## 2023-12-01 DIAGNOSIS — M25612 Stiffness of left shoulder, not elsewhere classified: Secondary | ICD-10-CM | POA: Diagnosis not present

## 2023-12-01 DIAGNOSIS — M25512 Pain in left shoulder: Secondary | ICD-10-CM | POA: Diagnosis not present

## 2023-12-07 DIAGNOSIS — M25612 Stiffness of left shoulder, not elsewhere classified: Secondary | ICD-10-CM | POA: Diagnosis not present

## 2023-12-07 DIAGNOSIS — S42292A Other displaced fracture of upper end of left humerus, initial encounter for closed fracture: Secondary | ICD-10-CM | POA: Diagnosis not present

## 2023-12-07 DIAGNOSIS — Z96612 Presence of left artificial shoulder joint: Secondary | ICD-10-CM | POA: Diagnosis not present

## 2023-12-07 DIAGNOSIS — M25512 Pain in left shoulder: Secondary | ICD-10-CM | POA: Diagnosis not present

## 2023-12-09 DIAGNOSIS — S42292A Other displaced fracture of upper end of left humerus, initial encounter for closed fracture: Secondary | ICD-10-CM | POA: Diagnosis not present

## 2023-12-09 DIAGNOSIS — Z96612 Presence of left artificial shoulder joint: Secondary | ICD-10-CM | POA: Diagnosis not present

## 2023-12-09 DIAGNOSIS — M25612 Stiffness of left shoulder, not elsewhere classified: Secondary | ICD-10-CM | POA: Diagnosis not present

## 2023-12-09 DIAGNOSIS — M25512 Pain in left shoulder: Secondary | ICD-10-CM | POA: Diagnosis not present

## 2023-12-12 DIAGNOSIS — Z96612 Presence of left artificial shoulder joint: Secondary | ICD-10-CM | POA: Diagnosis not present

## 2023-12-21 DIAGNOSIS — Z1231 Encounter for screening mammogram for malignant neoplasm of breast: Secondary | ICD-10-CM | POA: Diagnosis not present

## 2023-12-27 DIAGNOSIS — Z23 Encounter for immunization: Secondary | ICD-10-CM | POA: Diagnosis not present

## 2024-01-04 DIAGNOSIS — E559 Vitamin D deficiency, unspecified: Secondary | ICD-10-CM | POA: Diagnosis not present

## 2024-01-04 DIAGNOSIS — R5383 Other fatigue: Secondary | ICD-10-CM | POA: Diagnosis not present

## 2024-01-04 DIAGNOSIS — N1831 Chronic kidney disease, stage 3a: Secondary | ICD-10-CM | POA: Diagnosis not present

## 2024-01-04 DIAGNOSIS — I10 Essential (primary) hypertension: Secondary | ICD-10-CM | POA: Diagnosis not present

## 2024-01-04 DIAGNOSIS — R739 Hyperglycemia, unspecified: Secondary | ICD-10-CM | POA: Diagnosis not present

## 2024-01-04 DIAGNOSIS — Z13220 Encounter for screening for lipoid disorders: Secondary | ICD-10-CM | POA: Diagnosis not present

## 2024-01-11 DIAGNOSIS — E782 Mixed hyperlipidemia: Secondary | ICD-10-CM | POA: Diagnosis not present

## 2024-01-11 DIAGNOSIS — F331 Major depressive disorder, recurrent, moderate: Secondary | ICD-10-CM | POA: Diagnosis not present

## 2024-01-11 DIAGNOSIS — Z6824 Body mass index (BMI) 24.0-24.9, adult: Secondary | ICD-10-CM | POA: Diagnosis not present

## 2024-01-11 DIAGNOSIS — E7849 Other hyperlipidemia: Secondary | ICD-10-CM | POA: Diagnosis not present

## 2024-01-11 DIAGNOSIS — N1831 Chronic kidney disease, stage 3a: Secondary | ICD-10-CM | POA: Diagnosis not present

## 2024-01-11 DIAGNOSIS — I10 Essential (primary) hypertension: Secondary | ICD-10-CM | POA: Diagnosis not present
